# Patient Record
Sex: Female | Born: 1955
Health system: Southern US, Community
[De-identification: ages and names within clinical notes are randomized; demographics above are authoritative.]

## PROBLEM LIST (undated history)

## (undated) DIAGNOSIS — I471 Supraventricular tachycardia, unspecified: Secondary | ICD-10-CM

## (undated) DIAGNOSIS — E288 Other ovarian dysfunction: Secondary | ICD-10-CM

## (undated) DIAGNOSIS — K219 Gastro-esophageal reflux disease without esophagitis: Secondary | ICD-10-CM

## (undated) DIAGNOSIS — E785 Hyperlipidemia, unspecified: Secondary | ICD-10-CM

## (undated) DIAGNOSIS — K449 Diaphragmatic hernia without obstruction or gangrene: Secondary | ICD-10-CM

## (undated) DIAGNOSIS — E2839 Other primary ovarian failure: Secondary | ICD-10-CM

## (undated) DIAGNOSIS — G5711 Meralgia paresthetica, right lower limb: Secondary | ICD-10-CM

## (undated) DIAGNOSIS — G4700 Insomnia, unspecified: Secondary | ICD-10-CM

## (undated) DIAGNOSIS — J45909 Unspecified asthma, uncomplicated: Secondary | ICD-10-CM

## (undated) DIAGNOSIS — N83209 Unspecified ovarian cyst, unspecified side: Secondary | ICD-10-CM

## (undated) DIAGNOSIS — K2950 Unspecified chronic gastritis without bleeding: Secondary | ICD-10-CM

## (undated) DIAGNOSIS — L900 Lichen sclerosus et atrophicus: Secondary | ICD-10-CM

## (undated) DIAGNOSIS — G43909 Migraine, unspecified, not intractable, without status migrainosus: Secondary | ICD-10-CM

## (undated) DIAGNOSIS — K5790 Diverticulosis of intestine, part unspecified, without perforation or abscess without bleeding: Secondary | ICD-10-CM

## (undated) HISTORY — DX: Unspecified ovarian cyst, unspecified side: N83.209

## (undated) HISTORY — PX: CARDIAC ELECTROPHYSIOLOGY STUDY AND ABLATION: SHX1294

## (undated) HISTORY — DX: Meralgia paresthetica, right lower limb: G57.11

## (undated) HISTORY — DX: Other ovarian dysfunction: E28.8

## (undated) HISTORY — DX: Hyperlipidemia, unspecified: E78.5

## (undated) HISTORY — DX: Unspecified chronic gastritis without bleeding: K29.50

## (undated) HISTORY — DX: Migraine, unspecified, not intractable, without status migrainosus: G43.909

## (undated) HISTORY — DX: Supraventricular tachycardia: I47.1

## (undated) HISTORY — PX: SHOULDER SURGERY: SHX246

## (undated) HISTORY — DX: Unspecified asthma, uncomplicated: J45.909

## (undated) HISTORY — DX: Gastro-esophageal reflux disease without esophagitis: K21.9

## (undated) HISTORY — DX: Supraventricular tachycardia, unspecified: I47.10

## (undated) HISTORY — DX: Other primary ovarian failure: E28.39

## (undated) HISTORY — DX: Diverticulosis of intestine, part unspecified, without perforation or abscess without bleeding: K57.90

## (undated) HISTORY — DX: Diaphragmatic hernia without obstruction or gangrene: K44.9

## (undated) HISTORY — DX: Insomnia, unspecified: G47.00

## (undated) HISTORY — PX: OOPHORECTOMY: SHX86

## (undated) HISTORY — PX: TEMPOROMANDIBULAR JOINT SURGERY: SHX35

## (undated) HISTORY — DX: Lichen sclerosus et atrophicus: L90.0

---

## 1998-02-25 ENCOUNTER — Other Ambulatory Visit: Admission: RE | Admit: 1998-02-25 | Discharge: 1998-02-25 | Payer: Self-pay | Admitting: Obstetrics and Gynecology

## 1998-06-28 ENCOUNTER — Ambulatory Visit (HOSPITAL_COMMUNITY): Admission: RE | Admit: 1998-06-28 | Discharge: 1998-06-28 | Payer: Self-pay | Admitting: Emergency Medicine

## 1998-06-28 ENCOUNTER — Encounter: Payer: Self-pay | Admitting: Emergency Medicine

## 1999-03-20 ENCOUNTER — Other Ambulatory Visit: Admission: RE | Admit: 1999-03-20 | Discharge: 1999-03-20 | Payer: Self-pay | Admitting: Obstetrics and Gynecology

## 1999-04-10 ENCOUNTER — Encounter: Payer: Self-pay | Admitting: Emergency Medicine

## 1999-04-10 ENCOUNTER — Emergency Department (HOSPITAL_COMMUNITY): Admission: EM | Admit: 1999-04-10 | Discharge: 1999-04-10 | Payer: Self-pay | Admitting: Emergency Medicine

## 2000-05-13 ENCOUNTER — Other Ambulatory Visit: Admission: RE | Admit: 2000-05-13 | Discharge: 2000-05-13 | Payer: Self-pay | Admitting: Obstetrics and Gynecology

## 2001-07-14 ENCOUNTER — Other Ambulatory Visit: Admission: RE | Admit: 2001-07-14 | Discharge: 2001-07-14 | Payer: Self-pay | Admitting: Obstetrics and Gynecology

## 2002-07-17 ENCOUNTER — Other Ambulatory Visit: Admission: RE | Admit: 2002-07-17 | Discharge: 2002-07-17 | Payer: Self-pay | Admitting: Obstetrics and Gynecology

## 2003-08-19 ENCOUNTER — Other Ambulatory Visit: Admission: RE | Admit: 2003-08-19 | Discharge: 2003-08-19 | Payer: Self-pay | Admitting: Obstetrics and Gynecology

## 2004-07-23 ENCOUNTER — Emergency Department (HOSPITAL_COMMUNITY): Admission: EM | Admit: 2004-07-23 | Discharge: 2004-07-23 | Payer: Self-pay | Admitting: Emergency Medicine

## 2004-08-07 ENCOUNTER — Other Ambulatory Visit: Admission: RE | Admit: 2004-08-07 | Discharge: 2004-08-07 | Payer: Self-pay | Admitting: Obstetrics and Gynecology

## 2004-08-30 ENCOUNTER — Ambulatory Visit: Payer: Self-pay | Admitting: Internal Medicine

## 2004-09-19 ENCOUNTER — Ambulatory Visit: Payer: Self-pay

## 2004-09-19 ENCOUNTER — Ambulatory Visit: Payer: Self-pay | Admitting: Internal Medicine

## 2004-10-09 ENCOUNTER — Ambulatory Visit: Payer: Self-pay | Admitting: Internal Medicine

## 2004-10-12 ENCOUNTER — Ambulatory Visit: Payer: Self-pay

## 2004-12-04 ENCOUNTER — Ambulatory Visit: Payer: Self-pay | Admitting: Internal Medicine

## 2005-01-17 ENCOUNTER — Ambulatory Visit: Payer: Self-pay | Admitting: Internal Medicine

## 2005-01-25 ENCOUNTER — Ambulatory Visit: Payer: Self-pay | Admitting: Internal Medicine

## 2005-02-01 ENCOUNTER — Ambulatory Visit (HOSPITAL_COMMUNITY): Admission: AD | Admit: 2005-02-01 | Discharge: 2005-02-02 | Payer: Self-pay | Admitting: Internal Medicine

## 2005-02-01 ENCOUNTER — Ambulatory Visit: Payer: Self-pay | Admitting: Internal Medicine

## 2005-02-22 ENCOUNTER — Ambulatory Visit: Payer: Self-pay | Admitting: Internal Medicine

## 2005-03-28 ENCOUNTER — Ambulatory Visit: Payer: Self-pay | Admitting: Internal Medicine

## 2005-06-12 ENCOUNTER — Ambulatory Visit: Payer: Self-pay | Admitting: Internal Medicine

## 2005-06-12 ENCOUNTER — Ambulatory Visit (HOSPITAL_COMMUNITY): Admission: RE | Admit: 2005-06-12 | Discharge: 2005-06-12 | Payer: Self-pay | Admitting: Internal Medicine

## 2005-08-02 ENCOUNTER — Ambulatory Visit: Payer: Self-pay | Admitting: Internal Medicine

## 2005-08-28 ENCOUNTER — Other Ambulatory Visit: Admission: RE | Admit: 2005-08-28 | Discharge: 2005-08-28 | Payer: Self-pay | Admitting: Obstetrics and Gynecology

## 2006-03-15 ENCOUNTER — Ambulatory Visit: Payer: Self-pay | Admitting: Internal Medicine

## 2006-08-30 ENCOUNTER — Other Ambulatory Visit: Admission: RE | Admit: 2006-08-30 | Discharge: 2006-08-30 | Payer: Self-pay | Admitting: Obstetrics and Gynecology

## 2007-05-15 ENCOUNTER — Ambulatory Visit: Payer: Self-pay | Admitting: Internal Medicine

## 2007-09-16 ENCOUNTER — Encounter: Payer: Self-pay | Admitting: Obstetrics and Gynecology

## 2007-09-16 ENCOUNTER — Ambulatory Visit: Payer: Self-pay | Admitting: Obstetrics and Gynecology

## 2007-09-16 ENCOUNTER — Other Ambulatory Visit: Admission: RE | Admit: 2007-09-16 | Discharge: 2007-09-16 | Payer: Self-pay | Admitting: Obstetrics and Gynecology

## 2007-11-21 ENCOUNTER — Encounter: Admission: RE | Admit: 2007-11-21 | Discharge: 2007-11-21 | Payer: Self-pay | Admitting: Emergency Medicine

## 2008-09-29 ENCOUNTER — Other Ambulatory Visit: Admission: RE | Admit: 2008-09-29 | Discharge: 2008-09-29 | Payer: Self-pay | Admitting: Obstetrics and Gynecology

## 2008-09-29 ENCOUNTER — Encounter: Payer: Self-pay | Admitting: Obstetrics and Gynecology

## 2008-09-29 ENCOUNTER — Ambulatory Visit: Payer: Self-pay | Admitting: Obstetrics and Gynecology

## 2009-10-11 ENCOUNTER — Other Ambulatory Visit: Admission: RE | Admit: 2009-10-11 | Discharge: 2009-10-11 | Payer: Self-pay | Admitting: Obstetrics and Gynecology

## 2009-10-11 ENCOUNTER — Ambulatory Visit: Payer: Self-pay | Admitting: Obstetrics and Gynecology

## 2010-03-10 ENCOUNTER — Telehealth: Payer: Self-pay | Admitting: Internal Medicine

## 2010-03-23 NOTE — Progress Notes (Signed)
Summary: surgical clearance  Phone Note Call from Patient Call back at Home Phone [phone]   Caller: Patient Reason for Call: Talk to Nurse Summary of Call: pt states she needs surgical clearance fax to dr. Turner Daniels office [facility] # [phone] for shoulder surgery. Initial call taken by: Roe Coombs,  March 10, 2010 1:15 PM  Follow-up for Phone Call        LM FOR [facility] AT [phone] FROM DR ROWAN'S OFFICE TO Cleotis Lema  RE PT'S SURGERY. KATHY AWARE DR Graciela Husbands LAST SAW PT IN 4/09 STABLE AND TO RETURN AS NEEDED .PER PT NO CARDIAC COMPLAINTS  WILL F/U WITH PMD FOR SX CLEARANCE.PT TO CALL AND MAKE ARRANGEMENTS. DR Cecille Po OFFICE AWARE OF ABOVE AS WELL. Follow-up by: Scherrie Bateman, LPN,  March 10, 2010 1:39 PM  Additional Follow-up for Phone Call Additional follow up Details #1::        thx appropriate Additional Follow-up by: Nathen May, MD, [facility],  March 13, 2010 6:22 PM

## 2010-03-24 ENCOUNTER — Telehealth (INDEPENDENT_AMBULATORY_CARE_PROVIDER_SITE_OTHER): Payer: Self-pay | Admitting: *Deleted

## 2010-03-28 NOTE — Progress Notes (Signed)
Summary: Records Request  Faxed OV & EKG to [facility] at [facility] [phone]). Debby Freiberg  March 24, 2010 11:46 AM

## 2010-05-30 NOTE — Assessment & Plan Note (Signed)
[facility]                         ELECTROPHYSIOLOGY OFFICE NOTE   NAME:Authier, Kim Yu                     MRN:          [id number]  DATE:05/15/2007                            DOB:          06-30-1955    Ms. Kim Yu comes in followup for her atrial tachycardia, this has been  largely quiescent.  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   She had no complaints of exercise intolerance or chest pain or shortness  of breath.  There was no edema.   Her issues relate primarily to her migraine headaches and she takes a  series of medications mostly on an as-needed basis for them.   EXAMINATION:  Her weight is down 7 pounds from last year at 161, her  blood pressure is 112/74, the pulse of 80.  LUNGS:  Clear, but they were markedly diminished on the right side  compared to the left both anteriorly and posteriorly.  HEART SOUNDS:  Regular.  EXTREMITIES:  Without edema.  SKIN:  Warm and dry.   Because of the abnormality of her breath sounds, I undertook a chest x-  ray which appears to be normal.  Radiograph interpretation is pending.   Her electrocardiogram demonstrated sinus rhythm at 76;  there was a  0.13/0.08/0.39, axis was 79 degrees.   IMPRESSION:  1. Atrial tachycardia status post ablation with minimal clinical      effects at this point.  2. Abnormal breath sounds with normal chest x-ray.  3. Migraine headaches.   [name] is stable, will plan to see her again as needed.     Duke Salvia, MD, [facility]  Electronically Signed    SCK/MedQ  DD: 05/15/2007  DT: 05/15/2007  Job #: [id number]   cc:   Reuben Likes, M.D.

## 2010-06-02 NOTE — Assessment & Plan Note (Signed)
[facility]                         ELECTROPHYSIOLOGY OFFICE NOTE   NAME:[name], Kim BROCKSMITH                     MRN:          [id number]  DATE:03/15/2006                            DOB:          [birth year]    [name] comes in.  She has had one episode of palpitations since  we saw her last.  It lasted about 5 minutes.   She has undergone a procedure with a double atrial tachycardia ablation  and then she went back to the lab in May for repeat procedure which was  unsuccessful, the details of which I do not have.  She has done really  well with only a few minutes of tachypalpitations in January.  Otherwise, she has no symptoms.  On examination, her blood pressure is  110/66, pulse was 80.  Lungs were clear, heart sounds were regular, and  extremities were without edema.   Electrocardiogram dated today demonstrated sinus rhythm at 85 with  intervals of 0.13/0.08/0.35.   IMPRESSION:  1. Atrial tachycardia - multiple with procedure x2.  2. Intermittent recurrent tachypalpitations.   Will plan to see her again in 1 year's time.     Duke Salvia, MD, [facility]  Electronically Signed    SCK/MedQ  DD: 03/15/2006  DT: 03/15/2006  Job #: [id number]   cc:   Reuben Likes, M.D.

## 2010-06-02 NOTE — H&P (Signed)
NAME:  Kim [name], Kim [name] NO.:  [id number]   MEDICAL RECORD NO.:  [id number]          PATIENT TYPE:  OIB   LOCATION:  2899                         FACILITY:  [facility]   PHYSICIAN:  Duke Salvia, M.D.  DATE OF BIRTH:  05/24/1955   DATE OF ADMISSION:  06/12/2005  DATE OF DISCHARGE:                                HISTORY & PHYSICAL   ELECTROPHYSIOLOGIST:  Dr. Sherryl Manges.   PRIMARY CARE-GIVER:  Dr. Leslee Home.   ALLERGIES:  DARVON/COMPAZINE.   PRESENTING CIRCUMSTANCE:  I'm for another go-round.   HISTORY OF PRESENT ILLNESS:  Kim [name] is a 55 year old female with  a history of tachy-palpitation manifested as heart racing and presyncope.  She had ESI-guided EPS with radiofrequency catheter ablation of A-V nodal  reentry dysrhythmia AND high right atrial cristal tachycardia on February 01, 2005.   She has had recurrence, though she states the feeling is different with her  current tachyarrhythmia.  She feels palpitation and dyspnea.  They occur  maybe once every 2 weeks.  They are dispelled by p.r.n. Cardizem.   Loop recording demonstrates an atrial tachycardia.  The patient presents for  further EPS/radiofrequency catheter ablation of this dysrhythmia.   PAST MEDICAL HISTORY:  1.  Tachyarrhythmias as in history of present illness.  2.  Migraine headaches.   The patient denies myocardial infarction, cerebrovascular accident,  hypertension, diabetes, deep venous thrombosis, pulmonary embolism, peptic  ulcer disease, lower extremity edema, orthopnea, frank syncope, vertigo,  left lower extremity ulcerations, paroxysmal nocturnal dyspnea, nocturia,  anxiety, depression, weight gain or loss, no fevers, chills, nausea or  vomiting.  Positives are migraines/palpitations/presyncope.   MEDICATIONS:  1.  Imipramine 50 mg daily at bedtime.  2.  Trazodone 25 mg daily at bedtime.  3.  Aspirin 81 mg daily.  4.  __________  at bedtime.  5.  Diltiazem 30 mg as  needed.  6.  Metoclopramide 10 mg as needed for migraine.  7.  Ergotamine/caffeine p.r.n. for migraines.  8.  Hydrocodone 7.5 mg as needed for migraines.  9.  Promethazine 25 mg as needed for migraines.  10. Aleve to take as needed for migraines.  11. The patient also takes Os-Cal 500 mg daily, fish oil capsules daily and      a Centrum vitamin daily.   SOCIAL HISTORY:  The patient lives in [place].  She is married.  She is a  Runner, broadcasting/film/video.  No tobacco habituation.  Occasional alcohol.   FAMILY HISTORY:  Noncontributory.   PHYSICAL EXAMINATION:  VITAL SIGNS:  Temperature 97.9, blood pressure  120/83, pulse is 102, respiration rate is 29, oxygen saturation 60% on room  air.  GENERAL:  The patient is alert and oriented x3, in no acute distress.  HEENT:  Eyes:  Pupils are equal, round and reactive to light.  Extraocular  movements are intact.  No nasal discharge.  NECK:  Supple.  No carotid bruits auscultated.  No jugular venous  distention.  No cervical lymphadenopathy.  LUNGS:  Clear to auscultation and percussion bilaterally.  HEART:  Regular rate and rhythm with S1 and S2  and no murmur.  ABDOMEN:  Soft and non-distended.  Bowel sounds are present and nontender.  Abdominal aorta is non-pulsatile and non-expansile.  There was no  hepatosplenomegaly.  EXTREMITIES:  No edema.  Dorsalis pedis pulses 4/4 bilaterally.  Radial  pulses 4/4 bilaterally.  NEUROLOGIC:  No deficits.   IMPRESSION:  1.  Recurrent atrial tachyarrhythmias demonstrating as atrial tachycardia.  2.  Migraine headaches.   PLAN:  Electrophysiology study/radiofrequency catheter ablation by Dr.  Sherryl Manges.      Maple Mirza, P.A.    ______________________________  Duke Salvia, M.D.    GM/MEDQ  D:  06/12/2005  T:  06/12/2005  Job:  [id number]

## 2010-06-02 NOTE — Op Note (Signed)
NAME:  Kim [name], Kim [name] NO.:  [id number]   MEDICAL RECORD NO.:  [id number]          PATIENT TYPE:  OIB   LOCATION:  3732                         FACILITY:  [facility]   PHYSICIAN:  Duke Salvia, M.D.  DATE OF BIRTH:  May 17, 1955   DATE OF PROCEDURE:  02/01/2005  DATE OF DISCHARGE:                                 OPERATIVE REPORT   PREOPERATIVE DIAGNOSIS:  Supraventricular tachycardia.   POSTOPERATIVE DIAGNOSIS:  AV nodal re-entry tachycardia; high right atrial  cristal tachycardia.   PROCEDURE PERFORMED:  Invasive electrophysiologic study, arrhythmia mapping  x 2 with electromechanical assistance; radio frequency catheter ablation x  2; electrophysiologic study following drug infusion.   Following the obtaining of informed consent, the patient was brought to the  electrophysiology laboratory and placed on the fluoroscopic table in supine  position.  After routine prep and drape, cardiac catheterization was  performed with local anesthesia and conscious sedation.  Noninvasive blood  pressure monitoring and transcutaneous oxygen saturation monitoring and end  tidal CO2 monitoring were performed continuously throughout the procedure.  Following the procedure, the catheter was removed, hemostasis was obtained,  and the patient was transferred to the holding area in stable condition.  In  fact the sheaths were removed in the holding area.   CATHETERS:  A 5 French quadripolar catheter was inserted via the left  femoral vein to the atrioventricular junction.  A 5 French quadripolar catheter was inserted via the left femoral vein to  the right ventricular apex.  A 6 French octapolar catheter was inserted via the right femoral vein to the  coronary sinus.  A 7 French ESI Array catheter was inserted via the left femoral vein to the  high right atrium.  A 7 French 4 mm deflectable tip ablation catheter was inserted via an SL4  sheath to mapping sites in the posteroseptal  space for AV nodal re-entry and  to the high right atrium for mapping and ablation of the high right atrial  tachycardia.   Surface leads 1, aVF, and V1 were monitored continuously throughout the  procedure.   Following insertion of the catheters, the stimulation protocol included  incremental atrial pacing, incremental ventricular pacing, single and double  atrial extrastimuli at pace cycle lengths of 500 and 400 msec, burst atrial  pacing.   RESULTS:  Surface electrocardiogram.  Initial  rhythm was sinus; R-R interval: 689 msec; P-R interval: 124 msec;  QRS duration: 100 msec; QT interval 403 msec; P wave duration is 99 msec;  bundle branch block: Absent; pre-excitation: Absent.   A-H interval : 70 msec. HV interval: 54 msec.   Following AV nodal slow pathway modification.  Rhythm: Sinus; R-R interval: 77 msec; P-R interval: 142 msec; QRS duration:  85 msec; QT interval: 342 msec; P-wave duration: 120 msec; A-H interval: 96  msec; H-V interval: 50 msec.   Pre-excitation: Absent; bundle branch block: Absent.   Final post second ablation.  Rhythm: Sinus; R-R interval: 667 msec; P-R interval: 140 msec; QRS duration:  100 msec; Q-T interval: N/M; P-wave duration: N/M; bundle branch block:  Absent; pre-excitation: Absent.  A-H interval: 68 msec; H-V interval: 45 msec.   AV nodal Wenckebach was 350 msec and following ablation the AV nodal  effective refractory period at 500 msec was 340 msec.  AV nodal conduction pre-ablation  1.  Discontinued further inducible echo beats and SVT.  Following ablation, AV nodal conduction was continuous.   ACCESSORY PATHWAY FUNCTION:  No evidence of an accessory pathway was  identified.   ARRHYTHMIAS INDUCED:  1.  AV nodal re-entry tachycardia was reproducibly inducible with double      atrial extrastimuli delivered from the coronary sinus.  The cycle length      was approximately 330 msec.  It was terminated by atrial pacing.       Characteristics of the tachycardia are listed on the lab sheet but were      consistent with (1) earliest atrial activation His, (2) VA times very      close to 0 msec.   Following slow pathway modification as noted previously, AV nodal conduction  was continuous without inducible tachycardia.   Hereafter, isoproterenol was given up to 2 mcg per minute with rapid burst  atrial pacing at 220 msec reproducibly inducing an atrial tachycardia with a  cycle length of 340 to 360 msec.  It was characterized by V-A-A-V  conduction.  Endocardial activation mapping using the electro anatomical  array demonstrated earliest atrial activation in the high right crista.  Radio frequency energy was then delivered along the area of breakout in a  triangular fashion from early activation to about half way down to breakout  and creating a line across the path of activation.  Following ablation,  attempts to induce tachycardia were associated with recurrent induction of  atrial fibrillation, so further induction attempts were not undertaken.   FLUOROSCOPY TIME:  A total of 14 minutes and 75 seconds of fluoroscopy time  was utilized at 7.5 frames per second in biplane mode.  Radio frequency  energy at a total of 12 minutes and 5 seconds of radio frequency energy were  delivered at the two sites as noted previously, the AV nodal slow pathway as  well as the high right atrial cristal tachycardia.  AV nodal slow pathway  modification was successful; right atrial tachycardia was ablated  anatomically based on the endocardial array and attempts to reinduce  following the ablation were not pursued vigorously because of recurrent  atrial fibrillation requiring DC cardioversion.   IMPRESSION:  1.  Normal sinus function.  2.  Abnormal atrial function manifested by high right atrial tachycardia      that was successfully ablated. 3.  Abnormal AV nodal function manifested by dual antegrade AV nodal      physiology  with inducible slow-fast AV nodal re-entry tachycardia; slow      pathway modification eliminated the substrate for this tachycardia as      well .  4.  Normal His-Purkinje system function.  5.  No accessory pathway.  6.  Normal ventricular response to programmed stimulation.   In summary, the results of electrophysiologic testing confirmed two  different electrophysiologic substrates for the patient's two documented  tachycardias.  Slow pathway modification and high right atrial cristal  tachycardia were both eliminated using radio frequency energy with issues  regarding the long term predictive value of the atrial tachycardia modified  because of the inducible atrial fibrillation with post ablation testing.  However, I am still quite pleased with the anatomical distribution of  lesions in the area of the tachycardia.  Will plan to discontinue her medications, observe her overnight, anticipate  discharge in the morning.           ______________________________  Duke Salvia, M.D.     SCK/MEDQ  D:  02/01/2005  T:  02/01/2005  Job:  [id number]   cc:   Electrophysiology lab   Reuben Likes, M.D.  Fax: [phone]

## 2010-06-02 NOTE — Op Note (Signed)
NAME:  Kim [name], GROSECLOSE NO.:  [id number]   MEDICAL RECORD NO.:  [id number]          PATIENT TYPE:   LOCATION:                                 FACILITY:   PHYSICIAN:  Duke Salvia, MD, [facility]   DATE OF BIRTH:   DATE OF PROCEDURE:  DATE OF DISCHARGE:                               OPERATIVE REPORT   PREOPERATIVE DIAGNOSIS:  Supraventricular tachycardia.   POSTOPERATIVE DIAGNOSIS:  Atrial tachycardia, potentially left atrial.   PROCEDURE:  Invasive electrophysiological study, arrhythmia mapping and  RF catheter ablation.   DESCRIPTION OF PROCEDURE:  Following obtained informed consent, the  patient was brought to the electrophysiology laboratory and placed on  the fluoroscopic table in the supine position.  After routine prep and  drape, cardiac catheterization was performed with local anesthesia and  conscious sedation.  Noninvasive blood pressure monitoring,  transcutaneous oxygen saturation monitoring were performed continuously  throughout the procedure.  Following the procedure, the catheter was  removed.  Hemostasis was obtained and the patient was transferred to the  floor in stable condition.   The catheter was 5-French quadripolar catheter was inserted via the left  femoral vein to the AV junction.   A 7-French dual decapolar catheter was inserted via left femoral vein to  the tricuspid annulus with mapping sites of the right atrium.   A pair of ablation catheters were then inserted via the right femoral  vein to mapping sites in the right atrium.   __________ aVF __________ monitored continuously throughout the  procedure.  Following insertion of the catheters, the stimulation  protocol included incremental atrial pacing.   Incremental ventricular pacing.   Single atrial extra stimuli.   RESULTS:  1. Surface cardiogram and basic intervals.      a.     Rhythm:  Sinus; RR interval:  559 milliseconds; PR interval:       112 milliseconds; QRS  duration:  106 milliseconds; QT interval 381       milliseconds; P-wave duration 91 milliseconds.  Bundle branch       block:  Absent; pre-excitation:  Absent.      b.     AH interval:  75 milliseconds.  HV interval:  50       milliseconds.   FINAL MEASUREMENTS:  Rhythm:  Sinus; RR interval 598 milliseconds; PR  interval 147 milliseconds; QRS duration:  96 milliseconds; QT interval  356 milliseconds; P-wave duration 94 milliseconds.  AH interval:  N/R; HV interval:  N/R.  AV nodal function.  AV Wenckebach cycle length was 450 milliseconds.  Antegrade conduction was continuous.   ACCESSORY PATHWAY FUNCTION:  No accessory pathway was identified.   Arrhythmias induced:  An atrial tachycardia was induced and with bipolar  mapping bipolar dual catheter mapping was mapped to the crista.  The  earliest site was found to be about 40 milliseconds prior onset of  surface P.  However, we were not able to render the tachycardia non  inducible.  Low voltage early signals suggested that the origin might  have actually been coming from the left atrium and coming through  to the  right atrium posteriorly.   Fluoroscopy time:  A total of 25 minutes and 13 seconds of fluoroscopy  time was utilized at 7 and 1/2 frames per second in a biplane mode.   IMPRESSION:  Failed right atrial tachycardia ablation with extensive  arrhythmia mapping.   RECOMMENDATIONS:  The patient's recommendations were that a low dose  beta blocker be started.      Duke Salvia, MD, [facility]  Electronically Signed     SCK/MEDQ  D:  10/03/2006  T:  10/03/2006  Job:  [id number]

## 2010-06-02 NOTE — Discharge Summary (Signed)
NAME:  Kim [name], Kim [name] NO.:  [id number]   MEDICAL RECORD NO.:  [id number]          PATIENT TYPE:  OIB   LOCATION:  3732                         FACILITY:  [facility]   PHYSICIAN:  Maple Mirza, P.A. DATE OF BIRTH:  [birth year]   DATE OF ADMISSION:  02/01/2005  DATE OF DISCHARGE:  02/02/2005                                 DISCHARGE SUMMARY   ALLERGIES:  DARVON AND COMPAZINE.   PRINCIPAL DIAGNOSES:  1.  History of palpitations/presyncope with tachyarrhythmias.  2.  Day one status post electrophysiologic study, radiofrequency catheter      ablation of atrioventricular nodal reentry tachycardia.  3.  Atrial tachycardia.   DIAGNOSES:  1.  History of migraine headaches.  2.  Echocardiogram September 2006 shows ejection fraction normal, no wall      motion abnormalities.   PROCEDURES THIS HOSPITALIZATION:  February 01, 2005, electrophysiologic  study, radiofrequency catheter ablation of AV nodal reentry tachycardia in  the presence of atrial tachycardia, Dr. Sherryl Manges practitioner.   BRIEF HISTORY:  Ms. Kim [name] is a 55 year old female who has recurrent episodes  of tachycardia.  The patient wore a Holter monitor for a month but did not  see much.  She had an episode of palpitations and presyncope which recorded  sinus rhythm at about 110.  She had another episode where she was awakened  from sleep and had rapid pounding.  Her husband took her pulse at 160.  They  were frog positive.  They were diuretic positive and abated sometime  thereafter when the patient took diltiazem 30 mg on an as-needed basis.  The  patient has supraventricular tachycardia with probably AV nodal reentry  pattern +/- an atrial tachycardia.  Treatment options have been discussed  with the patient including continuing medications or electrophysiologic  study, radiofrequency catheter ablation.  The patient elects to have the  procedure done.  The risks and benefits have been described.  She  presented  electively.   HOSPITAL COURSE:  The patient presented on January 19th.  She underwent this  EP study and successful radiofrequency catheter ablation.  The patient  discharging day 1.  She had had no further tachyarrhythmia in the post-  procedure period. She is maintaining sinus rhythm.  She is achieving 95%  oxygen saturations at room air.  She developed a migraine headache after the  procedure on January 18th.  She was given hydrocodone metoclopramide and  Aleve for prophylaxis.  The patient discharging stable, satisfactory January  19th.   LABORATORY STUDIES ON ADMISSION:  Complete blood count:  White cells 5.7,  hemoglobin 12.6, hematocrit 36.4, platelets 310.  Serum electrolytes sodium  137, potassium 3.9, chloride 108, bicarbonate 26, BUN is 13, creatinine 0.9,  glucose 96.   MEDICATIONS:  1.  Include imipramine 50 mg daily.  2.  Trazodone 25 mg daily.  3.  Enteric-coated aspirin 81 mg daily.  She is to stay on this for at least      six weeks.  4.  Apri daily.  5.  Diltiazem 30 mg only as needed.  6.  Metoclopramide 10 mg as needed  for migraine.  7.  Ergotamine as needed for migraine.  8.  Vicodin 7.5/750 as needed.  9.  Promethazine 25 mg as needed.  10. Aleve as needed.  11. She is also to continue Centrum/fish oil/Os-Cal.   The patient is asked not to drive for the next two days, to lift nothing  heavier than 10 pounds for the next two weeks.  She may shower.  She is to  call [phone] if she experiences pain or swelling at the catheterization  site.  She will follow with Dr. Graciela Husbands 03/14 at 4 o'clock in the afternoon.   That is a 20 minute.      Maple Mirza, P.A.     GM/MEDQ  D:  02/02/2005  T:  02/02/2005  Job:  [id number]   cc:   Reuben Likes, M.D.  Fax: [phone]

## 2010-06-02 NOTE — Discharge Summary (Signed)
NAME:  [name], [name] NO.:  [id number]   MEDICAL RECORD NO.:  [id number]          PATIENT TYPE:  OIB   LOCATION:  2023                         FACILITY:  [facility]   PHYSICIAN:  Duke Salvia, M.D.  DATE OF BIRTH:  [birth year]   DATE OF ADMISSION:  06/12/2005  DATE OF DISCHARGE:  06/12/2005                                 DISCHARGE SUMMARY   PRIMARY CARE PHYSICIAN:  Dr. Leslee Home.   ELECTROPHYSIOLOGIST:  Dr. Duke Salvia.   REASON FOR ADMISSION:  Atrial tachyarrhythmias.   DISCHARGE DIAGNOSES:  1.  Atrial tachycardia.      1.  Status post unsuccessful attempt at radiofrequency catheter          ablation.  2.  Migraine headaches.   BRIEF HISTORY:  [name] is a 55 year old female patient with history of  tachypalpitations manifested as heart racing, increased syncope.  She had  radiofrequency catheter ablation of AV nodal reentrant tachycardia in  January of 2007.  She had recurrence of this and was admitted EP testing and  radiofrequency catheter ablation.   HOSPITAL COURSE:  The patient was brought in on Jun 12, 2005 as noted.  She  underwent EP testing by Dr. Graciela Husbands.  She is found to have atrial tachycardia.  Attempts were made at radiofrequency catheter ablation but this was  unsuccessful.  She tolerated the procedure well with no immediate  complication.  On the evening of Jun 12, 2005, Dr. Graciela Husbands saw the patient and  felt she was ready for discharge home.  She will need to remain on aspirin  81 mg a day for six weeks and follow up with Dr. Graciela Husbands in the next six to  eight weeks.   DISCHARGE MEDICATIONS:  1.  Coated aspirin 81 mg daily.  2.  Imipramine 50 mg q.h.s.  3.  Trazodone 25 mg q.h.s.  4.  Apri at bed time.  5.  Diltiazem 30 mg as needed.  6.  Metoclopramide 10 mg as needed for migraines.  7.  Ergotamine/caffeine p.r.n. migraines.  8.  Hydrocodone 7.5 mg as needed for migraines.  9.  Promethazine 25 mg as needed for migraines.  10. Aleve  as needed for migraines.  11. Os-Cal 500 mg a day.  12. Fish oil capsules.  13. Centrum vitamins.   ACTIVITY:  The patient is to increase her activity slowly over the next  week.  No driving, heavy lifting or sexual activity for three days.  She may  shower.   WOUND CARE:  She should call our office for any groin swelling, bleeding,  bruising or fever.  Follow up will be with Dr. Graciela Husbands in six to eight weeks  and the office will contact her with an appointment.      Tereso Newcomer, P.A.    ______________________________  Duke Salvia, M.D.    SW/MEDQ  D:  06/12/2005  T:  06/13/2005  Job:  [id number]   cc:   Reuben Likes, M.D.  Fax: [phone]

## 2010-06-02 NOTE — Assessment & Plan Note (Signed)
[facility]                              CARDIOLOGY OFFICE NOTE   NAME:Torpey, ARCHIE ATILANO                     MRN:          [id number]  DATE:08/02/2005                            DOB:          12-27-1955    Ms. Enyeart is seen following a double tachycardia ablation in January, a  repeat ablation in May, the report of which is pending but apparently was  not successful.  She is actually doing quite well with quiescent tachy  palpitations at the current time.  It may have been that there was some  progressive healing afterwards that has mitigated her tachycardia.  Currently she would like to hang in there as it is.  We will plan to keep  her on her current regimen, which includes no heart medications.  Her blood  pressure is 102/64, pulse was 92.  Lungs were clear.   Electrocardiogram demonstrated sinus rhythm at 92.   There was poor R-wave progression.  This not changed from before.   We will see her again in 6 months' time.                               Duke Salvia, MD, [facility], [facility], [facility]    SCK/MedQ  DD:  08/02/2005  DT:  08/02/2005  Job #:  [id number]   cc:   Reuben Likes, MD

## 2010-06-13 ENCOUNTER — Ambulatory Visit (INDEPENDENT_AMBULATORY_CARE_PROVIDER_SITE_OTHER): Payer: BC Managed Care – PPO | Admitting: Obstetrics and Gynecology

## 2010-06-13 DIAGNOSIS — N950 Postmenopausal bleeding: Secondary | ICD-10-CM

## 2010-06-17 ENCOUNTER — Emergency Department (INDEPENDENT_AMBULATORY_CARE_PROVIDER_SITE_OTHER): Payer: BC Managed Care – PPO

## 2010-06-17 ENCOUNTER — Emergency Department (HOSPITAL_BASED_OUTPATIENT_CLINIC_OR_DEPARTMENT_OTHER)
Admission: EM | Admit: 2010-06-17 | Discharge: 2010-06-17 | Disposition: A | Discharge status: Home / Self Care | Payer: BC Managed Care – PPO | Attending: Emergency Medicine | Admitting: Emergency Medicine

## 2010-06-17 DIAGNOSIS — M25569 Pain in unspecified knee: Secondary | ICD-10-CM

## 2010-06-17 DIAGNOSIS — W19XXXA Unspecified fall, initial encounter: Secondary | ICD-10-CM

## 2010-06-17 DIAGNOSIS — W010XXA Fall on same level from slipping, tripping and stumbling without subsequent striking against object, initial encounter: Secondary | ICD-10-CM | POA: Insufficient documentation

## 2010-06-17 DIAGNOSIS — Y92009 Unspecified place in unspecified non-institutional (private) residence as the place of occurrence of the external cause: Secondary | ICD-10-CM | POA: Insufficient documentation

## 2010-06-17 DIAGNOSIS — M25549 Pain in joints of unspecified hand: Secondary | ICD-10-CM

## 2010-06-17 DIAGNOSIS — S61409A Unspecified open wound of unspecified hand, initial encounter: Secondary | ICD-10-CM | POA: Insufficient documentation

## 2010-06-19 ENCOUNTER — Other Ambulatory Visit: Payer: Self-pay | Admitting: Obstetrics and Gynecology

## 2010-06-19 ENCOUNTER — Ambulatory Visit (INDEPENDENT_AMBULATORY_CARE_PROVIDER_SITE_OTHER): Payer: BC Managed Care – PPO | Admitting: Obstetrics and Gynecology

## 2010-06-19 ENCOUNTER — Other Ambulatory Visit: Payer: BC Managed Care – PPO

## 2010-06-19 DIAGNOSIS — N882 Stricture and stenosis of cervix uteri: Secondary | ICD-10-CM

## 2010-06-19 DIAGNOSIS — D259 Leiomyoma of uterus, unspecified: Secondary | ICD-10-CM

## 2010-06-19 DIAGNOSIS — N83339 Acquired atrophy of ovary and fallopian tube, unspecified side: Secondary | ICD-10-CM

## 2010-06-19 DIAGNOSIS — N950 Postmenopausal bleeding: Secondary | ICD-10-CM

## 2011-05-21 ENCOUNTER — Encounter: Payer: Self-pay | Admitting: Obstetrics and Gynecology

## 2011-05-24 ENCOUNTER — Encounter: Payer: Self-pay | Admitting: Gynecology

## 2011-05-24 DIAGNOSIS — N83209 Unspecified ovarian cyst, unspecified side: Secondary | ICD-10-CM | POA: Insufficient documentation

## 2011-05-24 DIAGNOSIS — G43909 Migraine, unspecified, not intractable, without status migrainosus: Secondary | ICD-10-CM | POA: Insufficient documentation

## 2011-05-24 DIAGNOSIS — K449 Diaphragmatic hernia without obstruction or gangrene: Secondary | ICD-10-CM | POA: Insufficient documentation

## 2011-05-31 ENCOUNTER — Encounter: Payer: Self-pay | Admitting: Obstetrics and Gynecology

## 2011-05-31 ENCOUNTER — Ambulatory Visit (INDEPENDENT_AMBULATORY_CARE_PROVIDER_SITE_OTHER): Payer: BC Managed Care – PPO | Admitting: Obstetrics and Gynecology

## 2011-05-31 VITALS — BP 124/76 | Ht 66.0 in | Wt 166.0 lb

## 2011-05-31 DIAGNOSIS — I471 Supraventricular tachycardia: Secondary | ICD-10-CM | POA: Insufficient documentation

## 2011-05-31 DIAGNOSIS — Z01419 Encounter for gynecological examination (general) (routine) without abnormal findings: Secondary | ICD-10-CM

## 2011-05-31 DIAGNOSIS — K219 Gastro-esophageal reflux disease without esophagitis: Secondary | ICD-10-CM | POA: Insufficient documentation

## 2011-05-31 NOTE — Progress Notes (Signed)
Patient came to see me today for her annual GYN exam. She is doing well without significant menopausal symptoms. She does have some mild vaginal dryness. They use a  lubricant which seems to do well. She is up-to-date on mammograms. She is up-to-date on colonoscopy. She is having no vaginal bleeding. She is having no pelvic pain or dyspareunia. She has had 2 normal bone densities. She does her lab through PCP.  Physical examination: Kim Yu present. HEENT within normal limits. Neck: Thyroid not large. No masses. Supraclavicular nodes: not enlarged. Breasts: Examined in both sitting and lying  position. No skin changes and no masses. Abdomen: Soft no guarding rebound or masses or hernia. Pelvic: External: Within normal limits. BUS: Within normal limits. Vaginal:within normal limits. Good estrogen effect. No evidence of cystocele rectocele or enterocele. Cervix: clean. Uterus: Normal size and shape. Adnexa: No masses. Rectovaginal exam: Confirmatory and negative. Extremities: Within normal limits.  Assessment: Normal GYN exam. Mild atrophic vaginitis.  Plan: Continue yearly mammograms. Call when necessary need for vaginal estrogen.

## 2011-06-01 LAB — URINALYSIS W MICROSCOPIC + REFLEX CULTURE
Bacteria, UA: NONE SEEN
Casts: NONE SEEN
Crystals: NONE SEEN
Hgb urine dipstick: NEGATIVE
Ketones, ur: NEGATIVE mg/dL
Nitrite: NEGATIVE
Specific Gravity, Urine: 1.017 (ref 1.005–1.030)
Urobilinogen, UA: 0.2 mg/dL (ref 0.0–1.0)
pH: 6.5 (ref 5.0–8.0)

## 2011-06-02 LAB — URINE CULTURE: Colony Count: 4000

## 2011-06-06 ENCOUNTER — Encounter: Payer: Self-pay | Admitting: Obstetrics and Gynecology

## 2011-07-16 ENCOUNTER — Telehealth: Payer: Self-pay

## 2011-07-16 DIAGNOSIS — N950 Postmenopausal bleeding: Secondary | ICD-10-CM

## 2011-07-16 NOTE — Telephone Encounter (Signed)
PT. NOTIFIED AND TRANSFERRED TO APPTS. ALSO PUT ORDER IN P.C.

## 2011-07-16 NOTE — Telephone Encounter (Signed)
HAS ALREADY BEEN MENOPAUSAL. STATES WHILE ON VACATION 2 WEEKS AGO HAD SPOTTING AND CRAMPING X 2 HOURS AND JUST WANTED TO CHECK WITH YOU ABOUT THIS.

## 2011-07-16 NOTE — Telephone Encounter (Signed)
She needs an office visit. I also would like to ultrasound her. Schedule back to back with office visit first.

## 2011-07-16 NOTE — Addendum Note (Signed)
Addended by: Venora Maples on: 07/16/2011 04:23 PM   Modules accepted: Orders

## 2011-07-23 NOTE — Addendum Note (Signed)
Addended byValeda Malm L on: 07/23/2011 03:01 PM   Modules accepted: Orders

## 2011-08-22 ENCOUNTER — Ambulatory Visit (INDEPENDENT_AMBULATORY_CARE_PROVIDER_SITE_OTHER): Payer: BC Managed Care – PPO | Admitting: Obstetrics and Gynecology

## 2011-08-22 ENCOUNTER — Ambulatory Visit (INDEPENDENT_AMBULATORY_CARE_PROVIDER_SITE_OTHER): Payer: BC Managed Care – PPO

## 2011-08-22 ENCOUNTER — Other Ambulatory Visit: Payer: Self-pay | Admitting: Obstetrics and Gynecology

## 2011-08-22 DIAGNOSIS — D251 Intramural leiomyoma of uterus: Secondary | ICD-10-CM

## 2011-08-22 DIAGNOSIS — N83339 Acquired atrophy of ovary and fallopian tube, unspecified side: Secondary | ICD-10-CM

## 2011-08-22 DIAGNOSIS — N950 Postmenopausal bleeding: Secondary | ICD-10-CM

## 2011-08-22 DIAGNOSIS — D259 Leiomyoma of uterus, unspecified: Secondary | ICD-10-CM

## 2011-08-22 NOTE — Addendum Note (Signed)
Addended by: Dayna Barker on: 08/22/2011 12:07 PM   Modules accepted: Orders

## 2011-08-22 NOTE — Progress Notes (Addendum)
Patient came back to see me today with 2 episodes of postmenopausal bleeding in the past 6 weeks. She bled a year ago and had a normal endometrial biopsy and saline infusion histogram. She has had no bleeding since then. She has had cramping as well and does have fibroids.  Exam: Kennon Portela present.Pelvic exam: External within normal limits. BUS within normal limits. Vaginal exam within normal limits. Cervix is clean without lesions. Uterus is normal size and shape. Adnexa failed to reveal masses. Rectovaginal examination is confirmatory and without masses.   Assessment: Postmenopausal bleeding  Plan: Endometrial biopsy done. Plan ultrasound later today.  Addendum: Patient came back and we proceeded with a saline infusion histogram. Her uterus is anteverted with multiple small fibroids. There has been no change in the fibroids since 2012. Her endometrial echo is 1.1 mm. Her right ovary is normal. Her left ovary is surgically absent but there are no masses in the left adnexa. Her cul-de-sac is free of fluid. Saline was introduced into the uterus and there were no intrauterine cavity defects. Patient was reassured. We had a preliminary discussion about endometrial ablation if bleeding persists. Patient given information and she will let us  know.

## 2011-08-22 NOTE — Patient Instructions (Addendum)
Remain in office for ultrasound.

## 2011-08-22 NOTE — Addendum Note (Signed)
Addended by: Dayna Barker on: 08/22/2011 12:58 PM   Modules accepted: Orders

## 2012-08-29 ENCOUNTER — Encounter: Payer: Self-pay | Admitting: Gynecology

## 2012-12-19 ENCOUNTER — Other Ambulatory Visit: Payer: Self-pay | Admitting: Gastroenterology

## 2012-12-19 DIAGNOSIS — R109 Unspecified abdominal pain: Secondary | ICD-10-CM

## 2012-12-23 ENCOUNTER — Ambulatory Visit
Admission: RE | Admit: 2012-12-23 | Discharge: 2012-12-23 | Disposition: A | Discharge status: Home / Self Care | Payer: BC Managed Care – PPO | Source: Ambulatory Visit | Attending: Gastroenterology | Admitting: Gastroenterology

## 2012-12-23 DIAGNOSIS — R109 Unspecified abdominal pain: Secondary | ICD-10-CM

## 2012-12-25 ENCOUNTER — Other Ambulatory Visit (HOSPITAL_COMMUNITY): Payer: Self-pay | Admitting: Gastroenterology

## 2012-12-25 DIAGNOSIS — R109 Unspecified abdominal pain: Secondary | ICD-10-CM

## 2012-12-25 DIAGNOSIS — R110 Nausea: Secondary | ICD-10-CM

## 2013-01-01 ENCOUNTER — Ambulatory Visit (HOSPITAL_COMMUNITY)
Admission: RE | Admit: 2013-01-01 | Discharge: 2013-01-01 | Disposition: A | Discharge status: Home / Self Care | Payer: 59 | Source: Ambulatory Visit | Attending: Gastroenterology | Admitting: Gastroenterology

## 2013-01-01 DIAGNOSIS — R110 Nausea: Secondary | ICD-10-CM | POA: Insufficient documentation

## 2013-01-01 DIAGNOSIS — R109 Unspecified abdominal pain: Secondary | ICD-10-CM | POA: Insufficient documentation

## 2013-01-01 MED ORDER — TECHNETIUM TC 99M MEBROFENIN IV KIT
5.0000 | PACK | Freq: Once | INTRAVENOUS | Status: AC | PRN
  Administered 2013-01-01: 5 via INTRAVENOUS

## 2013-10-23 ENCOUNTER — Other Ambulatory Visit: Payer: Self-pay | Admitting: Obstetrics and Gynecology

## 2013-10-23 DIAGNOSIS — R928 Other abnormal and inconclusive findings on diagnostic imaging of breast: Secondary | ICD-10-CM

## 2013-11-04 ENCOUNTER — Ambulatory Visit
Admission: RE | Admit: 2013-11-04 | Discharge: 2013-11-04 | Disposition: A | Discharge status: Home / Self Care | Payer: BC Managed Care – PPO | Source: Ambulatory Visit | Attending: Obstetrics and Gynecology | Admitting: Obstetrics and Gynecology

## 2013-11-04 DIAGNOSIS — R928 Other abnormal and inconclusive findings on diagnostic imaging of breast: Secondary | ICD-10-CM

## 2014-02-18 ENCOUNTER — Emergency Department (HOSPITAL_COMMUNITY)
Admission: EM | Admit: 2014-02-18 | Discharge: 2014-02-18 | Disposition: A | Discharge status: Home / Self Care | Payer: BC Managed Care – PPO | Attending: Emergency Medicine | Admitting: Emergency Medicine

## 2014-02-18 ENCOUNTER — Emergency Department (HOSPITAL_COMMUNITY): Payer: BC Managed Care – PPO

## 2014-02-18 ENCOUNTER — Encounter (HOSPITAL_COMMUNITY): Payer: Self-pay | Admitting: Emergency Medicine

## 2014-02-18 DIAGNOSIS — R05 Cough: Secondary | ICD-10-CM

## 2014-02-18 DIAGNOSIS — Z8742 Personal history of other diseases of the female genital tract: Secondary | ICD-10-CM | POA: Insufficient documentation

## 2014-02-18 DIAGNOSIS — Z7982 Long term (current) use of aspirin: Secondary | ICD-10-CM | POA: Insufficient documentation

## 2014-02-18 DIAGNOSIS — Z8679 Personal history of other diseases of the circulatory system: Secondary | ICD-10-CM | POA: Insufficient documentation

## 2014-02-18 DIAGNOSIS — K219 Gastro-esophageal reflux disease without esophagitis: Secondary | ICD-10-CM | POA: Insufficient documentation

## 2014-02-18 DIAGNOSIS — Z8639 Personal history of other endocrine, nutritional and metabolic disease: Secondary | ICD-10-CM | POA: Insufficient documentation

## 2014-02-18 DIAGNOSIS — R059 Cough, unspecified: Secondary | ICD-10-CM

## 2014-02-18 DIAGNOSIS — R079 Chest pain, unspecified: Secondary | ICD-10-CM | POA: Insufficient documentation

## 2014-02-18 DIAGNOSIS — R42 Dizziness and giddiness: Secondary | ICD-10-CM

## 2014-02-18 LAB — CBC
HEMATOCRIT: 39.6 % (ref 36.0–46.0)
Hemoglobin: 13.3 g/dL (ref 12.0–15.0)
MCH: 30.2 pg (ref 26.0–34.0)
MCHC: 33.6 g/dL (ref 30.0–36.0)
MCV: 90 fL (ref 78.0–100.0)
PLATELETS: 244 10*3/uL (ref 150–400)
RBC: 4.4 MIL/uL (ref 3.87–5.11)
RDW: 13 % (ref 11.5–15.5)
WBC: 4.4 10*3/uL (ref 4.0–10.5)

## 2014-02-18 LAB — I-STAT TROPONIN, ED: Troponin i, poc: 0 ng/mL (ref 0.00–0.08)

## 2014-02-18 LAB — BASIC METABOLIC PANEL
Anion gap: 5 (ref 5–15)
BUN: 13 mg/dL (ref 6–23)
CO2: 29 mmol/L (ref 19–32)
Calcium: 9.6 mg/dL (ref 8.4–10.5)
Chloride: 105 mmol/L (ref 96–112)
Creatinine, Ser: 0.74 mg/dL (ref 0.50–1.10)
GFR calc non Af Amer: >90 mL/min (ref >90)
Glucose, Bld: 130 mg/dL — ABNORMAL HIGH (ref 70–99)
POTASSIUM: 3.9 mmol/L (ref 3.5–5.1)
SODIUM: 139 mmol/L (ref 135–145)

## 2014-02-18 MED ORDER — ONDANSETRON HCL 4 MG/2ML IJ SOLN
4.0000 mg | Freq: Once | INTRAMUSCULAR | Status: AC
  Administered 2014-02-18: 4 mg via INTRAVENOUS
  Filled 2014-02-18: qty 2

## 2014-02-18 MED ORDER — MECLIZINE HCL 25 MG PO TABS
25.0000 mg | ORAL_TABLET | Freq: Once | ORAL | Status: AC
  Administered 2014-02-18: 25 mg via ORAL
  Filled 2014-02-18: qty 1

## 2014-02-18 MED ORDER — SODIUM CHLORIDE 0.9 % IV BOLUS (SEPSIS)
1000.0000 mL | Freq: Once | INTRAVENOUS | Status: AC
  Administered 2014-02-18: 1000 mL via INTRAVENOUS

## 2014-02-18 MED ORDER — MECLIZINE HCL 25 MG PO TABS: 25.0000 mg | ORAL_TABLET | Freq: Three times a day (TID) | ORAL | Status: DC | PRN

## 2014-02-18 NOTE — Discharge Instructions (Signed)
Chest Pain (Nonspecific) °It is often hard to give a specific diagnosis for the cause of chest pain. There is always a chance that your pain could be related to something serious, such as a heart attack or a blood clot in the lungs. You need to follow up with your health care provider for further evaluation. °CAUSES  °· Heartburn. °· Pneumonia or bronchitis. °· Anxiety or stress. °· Inflammation around your heart (pericarditis) or lung (pleuritis or pleurisy). °· A blood clot in the lung. °· A collapsed lung (pneumothorax). It can develop suddenly on its own (spontaneous pneumothorax) or from trauma to the chest. °· Shingles infection (herpes zoster virus). °The chest wall is composed of bones, muscles, and cartilage. Any of these can be the source of the pain. °· The bones can be bruised by injury. °· The muscles or cartilage can be strained by coughing or overwork. °· The cartilage can be affected by inflammation and become sore (costochondritis). °DIAGNOSIS  °Lab tests or other studies may be needed to find the cause of your pain. Your health care provider may have you take a test called an ambulatory electrocardiogram (ECG). An ECG records your heartbeat patterns over a 24-hour period. You may also have other tests, such as: °· Transthoracic echocardiogram (TTE). During echocardiography, sound waves are used to evaluate how blood flows through your heart. °· Transesophageal echocardiogram (TEE). °· Cardiac monitoring. This allows your health care provider to monitor your heart rate and rhythm in real time. °· Holter monitor. This is a portable device that records your heartbeat and can help diagnose heart arrhythmias. It allows your health care provider to track your heart activity for several days, if needed. °· Stress tests by exercise or by giving medicine that makes the heart beat faster. °TREATMENT  °· Treatment depends on what may be causing your chest pain. Treatment may include: °· Acid blockers for  heartburn. °· Anti-inflammatory medicine. °· Pain medicine for inflammatory conditions. °· Antibiotics if an infection is present. °· You may be advised to change lifestyle habits. This includes stopping smoking and avoiding alcohol, caffeine, and chocolate. °· You may be advised to keep your head raised (elevated) when sleeping. This reduces the chance of acid going backward from your stomach into your esophagus. °Most of the time, nonspecific chest pain will improve within 2-3 days with rest and mild pain medicine.  °HOME CARE INSTRUCTIONS  °· If antibiotics were prescribed, take them as directed. Finish them even if you start to feel better. °· For the next few days, avoid physical activities that bring on chest pain. Continue physical activities as directed. °· Do not use any tobacco products, including cigarettes, chewing tobacco, or electronic cigarettes. °· Avoid drinking alcohol. °· Only take medicine as directed by your health care provider. °· Follow your health care provider's suggestions for further testing if your chest pain does not go away. °· Keep any follow-up appointments you made. If you do not go to an appointment, you could develop lasting (chronic) problems with pain. If there is any problem keeping an appointment, call to reschedule. °SEEK MEDICAL CARE IF:  °· Your chest pain does not go away, even after treatment. °· You have a rash with blisters on your chest. °· You have a fever. °SEEK IMMEDIATE MEDICAL CARE IF:  °· You have increased chest pain or pain that spreads to your arm, neck, jaw, back, or abdomen. °· You have shortness of breath. °· You have an increasing cough, or you cough   up blood.  You have severe back or abdominal pain.  You feel nauseous or vomit.  You have severe weakness.  You faint.  You have chills. This is an emergency. Do not wait to see if the pain will go away. Get medical help at once. Call your local emergency services (911 in [place] Do not drive  yourself to the hospital. MAKE SURE YOU:  Benign Positional Vertigo Vertigo means you feel like you or your surroundings are moving when they are not. Benign positional vertigo is the most common form of vertigo. Benign means that the cause of your condition is not serious. Benign positional vertigo is more common in older adults. CAUSES  Benign positional vertigo is the result of an upset in the labyrinth system. This is an area in the middle ear that helps control your balance. This may be caused by a viral infection, head injury, or repetitive motion. However, often no specific cause is found. SYMPTOMS  Symptoms of benign positional vertigo occur when you move your head or eyes in different directions. Some of the symptoms may include:  Loss of balance and falls.  Vomiting.  Blurred vision.  Dizziness.  Nausea.  Involuntary eye movements (nystagmus). DIAGNOSIS  Benign positional vertigo is usually diagnosed by physical exam. If the specific cause of your benign positional vertigo is unknown, your caregiver may perform imaging tests, such as magnetic resonance imaging (MRI) or computed tomography (CT). TREATMENT  Your caregiver may recommend movements or procedures to correct the benign positional vertigo. Medicines such as meclizine, benzodiazepines, and medicines for nausea may be used to treat your symptoms. In rare cases, if your symptoms are caused by certain conditions that affect the inner ear, you may need surgery. HOME CARE INSTRUCTIONS   Follow your caregiver's instructions.  Move slowly. Do not make sudden body or head movements.  Avoid driving.  Avoid operating heavy machinery.  Avoid performing any tasks that would be dangerous to you or others during a vertigo episode.  Drink enough fluids to keep your urine clear or pale yellow. SEEK IMMEDIATE MEDICAL CARE IF:   You develop problems with walking, weakness, numbness, or using your arms, hands, or legs.  You  have difficulty speaking.  You develop severe headaches.  Your nausea or vomiting continues or gets worse.  You develop visual changes.  Your family or friends notice any behavioral changes.  Your condition gets worse.  You have a fever.  You develop a stiff neck or sensitivity to light. MAKE SURE YOU:   Understand these instructions.  Will watch your condition.  Will get help right away if you are not doing well or get worse. Document Released: 10/09/2005 Document Revised: 03/26/2011 Document Reviewed: 09/21/2010 [facility] Patient Information 2015 [place], [place]. This information is not intended to replace advice given to you by your health care provider. Make sure you discuss any questions you have with your health care provider.   Understand these instructions.  Will watch your condition.  Will get help right away if you are not doing well or get worse. Document Released: 10/11/2004 Document Revised: 01/06/2013 Document Reviewed: 08/07/2007 [facility] Patient Information 2015 [place], [place]. This information is not intended to replace advice given to you by your health care provider. Make sure you discuss any questions you have with your health care provider.

## 2014-02-18 NOTE — ED Notes (Signed)
Pt alert x4 respirations easy non labored. Ambulates with steady gait.

## 2014-02-18 NOTE — ED Notes (Signed)
Dr. Mingo Amber back in with pt

## 2014-02-18 NOTE — ED Provider Notes (Signed)
CSN: [id number]     Arrival date & time 02/18/14  0712 History   First MD Initiated Contact with Patient 02/18/14 0719     Chief Complaint  Patient presents with  . Chest Pain  . Dizziness     (Consider location/radiation/quality/duration/timing/severity/associated sxs/prior Treatment) Patient is a 59 y.o. female presenting with chest pain and dizziness. The history is provided by the patient.  Chest Pain Pain location:  L chest Pain quality: sharp   Pain radiates to:  Does not radiate Pain radiates to the back: no   Pain severity:  Mild Onset quality:  Sudden Duration: 1 minute. Timing:  Constant Progression:  Resolved Chronicity:  New Context: at rest   Relieved by:  Nothing Worsened by:  Nothing tried Associated symptoms: dizziness   Associated symptoms: no cough, no fever, no nausea, no shortness of breath and not vomiting   Dizziness Quality:  Imbalance Severity:  Mild Onset quality:  Gradual Timing:  Intermittent Progression:  Unchanged Chronicity:  New Context: standing up (this morning)   Relieved by:  Nothing Worsened by:  Nothing tried Associated symptoms: chest pain   Associated symptoms: no diarrhea, no nausea, no shortness of breath and no vomiting   Risk factors: no hx of stroke, no hx of vertigo and no new medications     Past Medical History  Diagnosis Date  . Ovarian cyst   . Premature ovarian failure   . Hiatal hernia   . Migraines   . SVT (supraventricular tachycardia)   . Acid reflux    Past Surgical History  Procedure Laterality Date  . Temporomandibular joint surgery    . Oophorectomy      LSO  . Cardiac electrophysiology study and ablation    . Shoulder surgery     Family History  Problem Relation Age of Onset  . Hypertension Mother   . Diabetes Mother   . Breast cancer Mother     Age 62  . Hypertension Father   . Lymphoma Maternal Aunt   . Ovarian cancer Maternal Aunt     [employer]  . Breast cancer Paternal Grandmother      Pat. GR GM-Age unknown   History  Substance Use Topics  . Smoking status: Never Smoker   . Smokeless tobacco: Not on file  . Alcohol Use: Not on file     Comment: rare   OB History    Gravida Para Term Preterm AB TAB SAB Ectopic Multiple Living   2 1 1  1  1   1      Review of Systems  Constitutional: Negative for fever and chills.  Respiratory: Negative for cough and shortness of breath.   Cardiovascular: Positive for chest pain.  Gastrointestinal: Negative for nausea, vomiting and diarrhea.  Neurological: Positive for dizziness.  All other systems reviewed and are negative.     Allergies  Compazine; Darvon; and Pantoprazole  Home Medications   Prior to Admission medications   Medication Sig Start Date End Date Taking? Authorizing Provider  aspirin 81 MG tablet Take 81 mg by mouth daily.   Yes Historical Provider, MD  Famotidine (PEPCID PO) Take by mouth.   Yes Historical Provider, MD  Naproxen Sodium (ALEVE PO) Take by mouth.   Yes Historical Provider, MD   BP 132/97 mmHg  Pulse 64  Temp(Src) 97.5 F (36.4 C) (Oral)  Resp 18  SpO2 99% Physical Exam  Constitutional: She is oriented to person, place, and time. She appears well-developed and well-nourished.  No distress.  HENT:  Head: Normocephalic and atraumatic.  Mouth/Throat: Oropharynx is clear and moist.  Eyes: EOM are normal. Pupils are equal, round, and reactive to light.  Neck: Normal range of motion. Neck supple.  Cardiovascular: Normal rate and regular rhythm.  Exam reveals no friction rub.   No murmur heard. Pulmonary/Chest: Effort normal and breath sounds normal. No respiratory distress. She has no wheezes. She has no rales.  Abdominal: Soft. She exhibits no distension. There is no tenderness. There is no rebound.  Musculoskeletal: Normal range of motion. She exhibits no edema.  Neurological: She is alert and oriented to person, place, and time. No cranial nerve deficit. She exhibits normal muscle  tone. Coordination normal.  Skin: No rash noted. She is not diaphoretic.  Nursing note and vitals reviewed.   ED Course  Procedures (including critical care time) Labs Review Labs Reviewed  [place], ED    Imaging Review Dg Chest 2 View  02/18/2014   CLINICAL DATA:  Sharp chest pain for 1 day  EXAM: CHEST  2 VIEW  COMPARISON:  None.  FINDINGS: Lungs are clear. Heart size and pulmonary vascularity are normal. No adenopathy. No pneumothorax. No bone lesions. There is slight mid thoracic levoscoliosis.  IMPRESSION: No edema or consolidation.   Electronically Signed   By: Lowella Grip M.D.   On: 02/18/2014 08:39     EKG Interpretation None      Date: 02/18/2014  Rate: 64  Rhythm: normal sinus rhythm  QRS Axis: normal  Intervals: normal  ST/T Wave abnormalities: normal  Conduction Disutrbances:none  Narrative Interpretation:   Old EKG Reviewed: none available    MDM   Final diagnoses:  Chest pain, unspecified chest pain type  Vertigo    SVT and acid reflux presents with chest pain and dizziness. Chest pain: 1 minute episode of sharp left-sided chest pain that did not radiate last night. Self-limited. Never had this chest pain before. No further episodes since last night. Treat of coronary artery disease, cardiac stents. Shortness of breath, cough, fever, palpitations. Dizziness: Began this morning, described as an off-balance feeling. Began after standing up this morning. Mild associated nausea. No history of vertigo, states she thinks this may be vertigo. 's not describe any spinning dizziness. Has associated nausea. Stated, "if I move around, the dizziness will come back".  Here exam benign. Normal gait. Did get nauseated when she got up to walk around, but no further dizziness. Normal finger to nose and heel to shin testing. Normal EKG.  We'll check labs, give meclizine. No neurologic deficits, does not need MRI at this  point.  Feeling better after meclizine. Ambulating with difficulty. Stable for discharge. Instructed to f/u with PCP.  Evelina Bucy, MD 02/18/14 [phone]

## 2014-02-18 NOTE — ED Notes (Signed)
Pt c/o left sided CP last night lasting less than 1 minute now resolved; pt sts dizziness and nausea today

## 2014-02-18 NOTE — ED Notes (Signed)
Pt ambulated in hallway and to bathroom. Pt denies any dizziness stated she was "feeling a lot better." Pt ambulated back to room and placed back on monitor. MD came in while the tech was placing her back on the monitor and she told MD she was feeling a lot better too.

## 2014-06-28 ENCOUNTER — Ambulatory Visit (INDEPENDENT_AMBULATORY_CARE_PROVIDER_SITE_OTHER): Payer: BC Managed Care – PPO

## 2014-06-28 ENCOUNTER — Ambulatory Visit (INDEPENDENT_AMBULATORY_CARE_PROVIDER_SITE_OTHER): Payer: BC Managed Care – PPO | Admitting: Podiatry

## 2014-06-28 ENCOUNTER — Encounter: Payer: Self-pay | Admitting: Podiatry

## 2014-06-28 VITALS — BP 95/66 | HR 69 | Resp 12

## 2014-06-28 DIAGNOSIS — M779 Enthesopathy, unspecified: Secondary | ICD-10-CM

## 2014-06-28 DIAGNOSIS — M79671 Pain in right foot: Secondary | ICD-10-CM

## 2014-06-28 MED ORDER — TRIAMCINOLONE ACETONIDE 10 MG/ML IJ SUSP
10.0000 mg | Freq: Once | INTRAMUSCULAR | Status: AC
  Administered 2014-06-28: 10 mg

## 2014-06-28 NOTE — Progress Notes (Signed)
Subjective:     Patient ID: Kim Yu, female   DOB: 1955-12-31, 59 y.o.   MRN: [id number]  HPI patient states I'm getting a lot of pain in my forefoot right for the last 3 months. I do not remember specific injury but it makes it hard to walk   Review of Systems  All other systems reviewed and are negative.      Objective:   Physical Exam  Constitutional: She is oriented to person, place, and time.  Cardiovascular: Intact distal pulses.   Musculoskeletal: Normal range of motion.  Neurological: She is oriented to person, place, and time.  Skin: Skin is warm.  Nursing note and vitals reviewed.  neurovascular status intact muscle strength adequate with range of motion subtalar midtarsal joint within normal limits. Patient's noted to have forefoot inflammation right with pain around the fourth metatarsophalangeal joint and is noted to have no significant edema in the foot or ankle. Patient has good digital perfusion is well oriented 3     Assessment:     Probable inflammatory capsulitis fourth MPJ right foot versus possible neuroma    Plan:     H&P and x-rays reviewed with patient. Did a proximal nerve block right aspirated the fourth joint and was able to get out a small amount of clear fluid and injected with a quarter cc of dexamethasone Kenalog. I then applied padding to take pressure off the joint and instructed on rigid Yu shoes. Patient will be seen back to recheck in one week

## 2014-06-28 NOTE — Progress Notes (Signed)
   Subjective:    Patient ID: Kim Yu, female    DOB: March 10, 1955, 59 y.o.   MRN: [id number]  HPI  PT RT BALL OF THE FOOT HAVE BURNING SENSATION FOR 3 MONTHS. THE FOOT IS GETTING WORSE WHEN WALKING OR FLEXING THE FOOT. TRIED TO WEAR TENNIS SHOES WITH ORTHOTICS BUT NO HELP.  Review of Systems  Musculoskeletal: Positive for gait problem.       Objective:   Physical Exam        Assessment & Plan:

## 2014-07-12 ENCOUNTER — Encounter: Payer: Self-pay | Admitting: Podiatry

## 2014-07-12 ENCOUNTER — Ambulatory Visit (INDEPENDENT_AMBULATORY_CARE_PROVIDER_SITE_OTHER): Payer: BC Managed Care – PPO | Admitting: Podiatry

## 2014-07-12 VITALS — BP 122/70 | HR 72 | Resp 12

## 2014-07-12 DIAGNOSIS — M779 Enthesopathy, unspecified: Secondary | ICD-10-CM | POA: Diagnosis not present

## 2014-07-12 DIAGNOSIS — M79673 Pain in unspecified foot: Secondary | ICD-10-CM

## 2014-07-12 NOTE — Progress Notes (Signed)
Subjective:     Patient ID: Kim Yu, female   DOB: 07-Jul-1955, 59 y.o.   MRN: [id number]  HPI patient states my right foot is doing quite a bit better with discomfort still if I been on it for a long time   Review of Systems     Objective:   Physical Exam Neurovascular status intact muscle strength adequate with significant diminishment of discomfort fourth metatarsophalangeal joint right foot    Assessment:     Improving capsulitis fourth MPJ right    Plan:     Reviewed physical therapy and supportive therapy for condition along with rigid Yu shoes. Patient will be seen back for [place] to recheck again as needed and may require orthotic treatment

## 2014-07-13 ENCOUNTER — Other Ambulatory Visit: Payer: Self-pay

## 2014-07-13 DIAGNOSIS — Z1231 Encounter for screening mammogram for malignant neoplasm of breast: Secondary | ICD-10-CM

## 2014-10-25 ENCOUNTER — Ambulatory Visit: Payer: BC Managed Care – PPO

## 2014-11-16 ENCOUNTER — Ambulatory Visit
Admission: RE | Admit: 2014-11-16 | Discharge: 2014-11-16 | Disposition: A | Discharge status: Home / Self Care | Payer: BC Managed Care – PPO | Source: Ambulatory Visit

## 2014-11-16 DIAGNOSIS — Z1231 Encounter for screening mammogram for malignant neoplasm of breast: Secondary | ICD-10-CM

## 2015-05-18 ENCOUNTER — Other Ambulatory Visit: Payer: Self-pay | Admitting: Gastroenterology

## 2015-10-27 ENCOUNTER — Telehealth: Payer: Self-pay | Admitting: Internal Medicine

## 2015-10-27 DIAGNOSIS — R002 Palpitations: Secondary | ICD-10-CM
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
## 2015-10-27 NOTE — Telephone Encounter (Signed)
Would think about holter if symptoms daily, otherwise a 30 day recorder Or ziio

## 2015-10-27 NOTE — Telephone Encounter (Signed)
I called and spoke with the patient. She states that for the last 1 & 1/2 weeks that she has been having a fluttering feeling in her chest mostly noted at night when she sits down. She is slightly breathless at times. Her daughter was in a car accident fairly recently, which put her in pre-term labor and she delivered at 8 & 1/2 months. The patient has been helping with the baby now that he is home. She is not sleeping well or eating regularly.  She does not consume caffeine. She did have a SVT ablation done January 2007 with Dr. Caryl Comes. She is slightly concerned about her symptoms. I have advised her that stress can certainly play a part in what she is feeling, and that she may be having some runs of SVT- bearing down will help for a little while with her symptoms.  She does not appear to be in any distress currently. I have advised her that she may follow up with her PCP, but have also offered her a follow up with Dr. Caryl Comes on 11/15/15. I advised her he may want her to wear a holter prior to see assess her rhythm. I will review with Dr. Caryl Comes and call her back. She states she will cancel the follow up with Dr. Caryl Comes if he feels she doesn't need this.

## 2015-10-31 NOTE — Telephone Encounter (Signed)
I left a message for the patient to call on her home and cell #'s.

## 2015-10-31 NOTE — Telephone Encounter (Signed)
I spoke with the patient today.  She is still having fluttering symptoms, but not as frequent. She is agreeable with a 30-day heart monitor. I advised I will order this and that scheduling will call her to arrange. She is agreeable.

## 2015-11-03 ENCOUNTER — Encounter: Payer: Self-pay | Admitting: Internal Medicine

## 2015-11-15 ENCOUNTER — Ambulatory Visit: Payer: BC Managed Care – PPO | Admitting: Internal Medicine

## 2015-11-16 ENCOUNTER — Ambulatory Visit (INDEPENDENT_AMBULATORY_CARE_PROVIDER_SITE_OTHER): Payer: BC Managed Care – PPO

## 2015-11-16 DIAGNOSIS — R002 Palpitations: Secondary | ICD-10-CM | POA: Diagnosis not present

## 2015-11-17 ENCOUNTER — Telehealth: Payer: Self-pay | Admitting: Internal Medicine

## 2015-11-17 NOTE — Telephone Encounter (Signed)
Called patient back and explained it will beep when she is not wearing it. I explained if she turns the cell phone off the whole system will have to be reset. So unfortunately you just have to deal with the beeps while showering.

## 2015-11-17 NOTE — Telephone Encounter (Signed)
Will forward to Katy/ Shelly in the holter room.

## 2015-11-17 NOTE — Telephone Encounter (Signed)
New Message  Pt call requesting to speak with RN. Pt states she wanted to know if she would need to turn the monitor off when bathing. Pt states monitor beeps. Please call back to discuss

## 2015-11-28 ENCOUNTER — Other Ambulatory Visit: Payer: Self-pay | Admitting: Obstetrics and Gynecology

## 2015-11-29 ENCOUNTER — Other Ambulatory Visit: Payer: Self-pay | Admitting: Obstetrics and Gynecology

## 2015-11-29 DIAGNOSIS — M8580 Other specified disorders of bone density and structure, unspecified site: Secondary | ICD-10-CM

## 2015-11-29 DIAGNOSIS — Z1231 Encounter for screening mammogram for malignant neoplasm of breast: Secondary | ICD-10-CM

## 2015-12-20 ENCOUNTER — Encounter (INDEPENDENT_AMBULATORY_CARE_PROVIDER_SITE_OTHER): Payer: Self-pay

## 2015-12-20 ENCOUNTER — Ambulatory Visit (INDEPENDENT_AMBULATORY_CARE_PROVIDER_SITE_OTHER): Payer: BC Managed Care – PPO | Admitting: Internal Medicine

## 2015-12-20 ENCOUNTER — Encounter: Payer: Self-pay | Admitting: Internal Medicine

## 2015-12-20 VITALS — BP 110/68 | HR 78 | Ht 66.0 in | Wt 174.2 lb

## 2015-12-20 DIAGNOSIS — I471 Supraventricular tachycardia: Secondary | ICD-10-CM

## 2015-12-20 NOTE — Progress Notes (Signed)
For      Kim Yu  Patient ID: Kim Yu, MRN: [id number], DOB/AGE: Feb 14, 1955 60 y.o. Admit date: (Not on file) Date of Consult: 12/20/2015  Primary Physician: Kim Schwalbe, MD Primary Cardiologist: *new Consulting Physician none  Chief Complaint: palpitations   HPI Kim Yu is a 60 y.o. female  Seen after a hiatus of about 5 years. She is a history of atrial tachycardia for which she underwent ablation 2007.    More recently, her daughter was involved in a car accident causing preterm labor. She started having complaints of shortness of breath and fluttering.   Kim Yu been very stressful in terms of caringfor her grandson.     She was given a 30 day monitor. Symptoms of fluttering were associated with PVCs and a 4 beat run of nonsustained atrial tachycardia          Past Medical History:  Diagnosis Date  . Acid reflux   . Hiatal hernia   . Migraines   . Ovarian cyst   . Premature ovarian failure   . SVT (supraventricular tachycardia) [facility])       Surgical History:  Past Surgical History:  Procedure Laterality Date  . CARDIAC ELECTROPHYSIOLOGY STUDY AND ABLATION    . OOPHORECTOMY     LSO  . SHOULDER SURGERY    . TEMPOROMANDIBULAR JOINT SURGERY       Home Meds: Prior to Admission medications   Medication Sig Start Date End Date Taking? Authorizing Provider  aspirin 81 MG tablet Take 81 mg by mouth daily.    Historical Provider, MD  Calcium Carb-Cholecalciferol (CALCIUM + D3 PO) Take by mouth daily.    Historical Provider, MD  Famotidine (PEPCID PO) Take 40 mg by mouth daily.     Historical Provider, MD  Fish Oil OIL by Does not apply route daily.    Historical Provider, MD  Naproxen Sodium (ALEVE PO) Take 440 mg by mouth as needed (pain).     Historical Provider, MD    Allergies:  Allergies  Allergen Reactions  . Compazine [Prochlorperazine Edisylate]   . Darvon [Propoxyphene Hcl]   . Pantoprazole     Social History    Social History  . Marital status: Married    Spouse name: N/A  . Number of children: N/A  . Years of education: N/A   Occupational History  . Not on file.   Social History Main Topics  . Smoking status: Never Smoker  . Smokeless tobacco: Never Used  . Alcohol use 0.0 oz/week     Comment: rare  . Drug use: No  . Sexual activity: Yes    Birth control/ protection: Post-menopausal   Other Topics Concern  . Not on file   Social History Narrative  . No narrative on file     Family History  Problem Relation Age of Onset  . Hypertension Mother   . Diabetes Mother   . Breast cancer Mother     Age 23  . Hypertension Father   . Lymphoma Maternal Aunt   . Ovarian cancer Maternal Aunt     Kim Yu  . Breast cancer Paternal Grandmother     Kim Yu     ROS:  Please see the history of present illness.     All other systems reviewed and negative.    Physical Exam:  Blood pressure 110/68, pulse 78, height 5\' 6"  (1.676 m), weight 174 lb 3.2 oz (79 kg), SpO2 95 %. General: Well  developed, well nourished female in no acute distress. Head: Normocephalic, atraumatic, sclera non-icteric, no xanthomas, nares are without discharge. EENT: normal  Lymph Nodes:  none Neck: Negative for carotid bruits. JVD not elevated. Back:without scoliosis kyphosis Lungs: Clear bilaterally to auscultation without wheezes, rales, or rhonchi. Breathing is unlabored. Heart: RRR with S1 S2. No  murmur . No rubs, or gallops appreciated. Abdomen: Soft, non-tender, non-distended with normoactive bowel sounds. No hepatomegaly. No rebound/guarding. No obvious abdominal masses. Msk:  Strength and tone appear normal for age. Extremities: No clubbing or cyanosis. No edema.  Distal pedal pulses are 2+ and equal bilaterally. Skin: Warm and Dry Neuro: Alert and oriented X 3. CN III-XII intact Grossly normal sensory and motor function . Psych:  Responds to questions appropriately with a normal  affect.      Labs: Cardiac Enzymes No results for input(s): CKTOTAL, CKMB, TROPONINI in the last 72 hours. CBC Lab Results  Component Value Date   WBC 4.4 02/18/2014   HGB 13.3 02/18/2014   HCT 39.6 02/18/2014   MCV 90.0 02/18/2014   PLT 244 02/18/2014   PROTIME: No results for input(s): LABPROT, INR in the last 72 hours. Chemistry No results for input(s): NA, K, CL, CO2, BUN, CREATININE, CALCIUM, PROT, BILITOT, ALKPHOS, ALT, AST, GLUCOSE in the last 168 hours.  Invalid input(s): LABALBU Lipids No results found for: CHOL, HDL, LDLCALC, TRIG BNP No results found for: PROBNP Thyroid Function Tests: No results for input(s): TSH, T4TOTAL, T3FREE, THYROIDAB in the last 72 hours.  Invalid input(s): FREET3 Miscellaneous No results found for: DDIMER  Radiology/Studies:  No results found.  EKG: sinus with normal intervals  Event recorder  symptons assoc with PVCs and nonsustained atrial tach   Assessment and Plan:  Palpitations  Recorder shows only infreq PVC and non sustained atrial tach Reassured her  Have encouraged her to take care of herself as she helps to take care of her premature grandson.      Kim Yu

## 2015-12-20 NOTE — Patient Instructions (Addendum)
Medication Instructions: - Your physician recommends that you continue on your current medications as directed. Please refer to the Current Medication list given to you today.  Labwork: - none ordered  Procedures/Testing: - none ordered  Follow-Up: - Dr. Klein will see you back on an as needed basis.  Any Additional Special Instructions Will Be Listed Below (If Applicable).     If you need a refill on your cardiac medications before your next appointment, please call your pharmacy.   

## 2016-01-03 ENCOUNTER — Telehealth: Payer: Self-pay

## 2016-01-03 NOTE — Telephone Encounter (Signed)
Left message to call back with monitor results.

## 2016-01-05 ENCOUNTER — Ambulatory Visit
Admission: RE | Admit: 2016-01-05 | Discharge: 2016-01-05 | Disposition: A | Discharge status: Home / Self Care | Payer: BC Managed Care – PPO | Source: Ambulatory Visit | Attending: Obstetrics and Gynecology | Admitting: Obstetrics and Gynecology

## 2016-01-05 DIAGNOSIS — Z1231 Encounter for screening mammogram for malignant neoplasm of breast: Secondary | ICD-10-CM

## 2016-01-05 DIAGNOSIS — M8580 Other specified disorders of bone density and structure, unspecified site: Secondary | ICD-10-CM

## 2016-12-21 ENCOUNTER — Other Ambulatory Visit: Payer: Self-pay | Admitting: Family Medicine

## 2016-12-21 DIAGNOSIS — Z1231 Encounter for screening mammogram for malignant neoplasm of breast: Secondary | ICD-10-CM

## 2017-01-28 ENCOUNTER — Inpatient Hospital Stay: Admission: RE | Admit: 2017-01-28 | Payer: BC Managed Care – PPO | Source: Ambulatory Visit

## 2017-01-29 ENCOUNTER — Ambulatory Visit
Admission: RE | Admit: 2017-01-29 | Discharge: 2017-01-29 | Disposition: A | Discharge status: Home / Self Care | Payer: BC Managed Care – PPO | Source: Ambulatory Visit | Attending: Family Medicine | Admitting: Family Medicine

## 2017-01-29 DIAGNOSIS — Z1231 Encounter for screening mammogram for malignant neoplasm of breast: Secondary | ICD-10-CM

## 2017-05-10 ENCOUNTER — Ambulatory Visit: Payer: BC Managed Care – PPO | Admitting: Podiatry

## 2018-05-16 ENCOUNTER — Other Ambulatory Visit: Payer: Self-pay | Admitting: Family Medicine

## 2018-05-16 DIAGNOSIS — Z1231 Encounter for screening mammogram for malignant neoplasm of breast: Secondary | ICD-10-CM

## 2018-07-11 ENCOUNTER — Ambulatory Visit
Admission: RE | Admit: 2018-07-11 | Discharge: 2018-07-11 | Disposition: A | Discharge status: Home / Self Care | Payer: BC Managed Care – PPO | Source: Ambulatory Visit | Attending: Family Medicine | Admitting: Family Medicine

## 2018-07-11 DIAGNOSIS — Z1231 Encounter for screening mammogram for malignant neoplasm of breast: Secondary | ICD-10-CM

## 2018-11-17 ENCOUNTER — Ambulatory Visit
Admission: RE | Admit: 2018-11-17 | Discharge: 2018-11-17 | Disposition: A | Discharge status: Home / Self Care | Payer: Medicare Other | Source: Ambulatory Visit | Attending: Physician Assistant | Admitting: Physician Assistant

## 2018-11-17 ENCOUNTER — Other Ambulatory Visit: Payer: Self-pay

## 2018-11-17 DIAGNOSIS — M79605 Pain in left leg: Secondary | ICD-10-CM

## 2019-03-12 ENCOUNTER — Ambulatory Visit: Payer: Medicare Other

## 2019-06-26 ENCOUNTER — Other Ambulatory Visit: Payer: Self-pay | Admitting: Family Medicine

## 2019-06-26 DIAGNOSIS — Z1231 Encounter for screening mammogram for malignant neoplasm of breast: Secondary | ICD-10-CM

## 2019-07-15 ENCOUNTER — Ambulatory Visit
Admission: RE | Admit: 2019-07-15 | Discharge: 2019-07-15 | Disposition: A | Discharge status: Home / Self Care | Payer: Medicare Other | Source: Ambulatory Visit | Attending: Family Medicine | Admitting: Family Medicine

## 2019-07-15 ENCOUNTER — Other Ambulatory Visit: Payer: Self-pay

## 2019-07-15 DIAGNOSIS — Z1231 Encounter for screening mammogram for malignant neoplasm of breast: Secondary | ICD-10-CM

## 2019-09-04 DIAGNOSIS — M25552 Pain in left hip: Secondary | ICD-10-CM | POA: Diagnosis not present

## 2019-09-25 DIAGNOSIS — Z01419 Encounter for gynecological examination (general) (routine) without abnormal findings: Secondary | ICD-10-CM | POA: Diagnosis not present

## 2019-09-25 DIAGNOSIS — Z6828 Body mass index (BMI) 28.0-28.9, adult: Secondary | ICD-10-CM | POA: Diagnosis not present

## 2019-09-25 DIAGNOSIS — L900 Lichen sclerosus et atrophicus: Secondary | ICD-10-CM | POA: Diagnosis not present

## 2019-09-28 DIAGNOSIS — F411 Generalized anxiety disorder: Secondary | ICD-10-CM | POA: Diagnosis not present

## 2019-09-30 DIAGNOSIS — M25552 Pain in left hip: Secondary | ICD-10-CM | POA: Diagnosis not present

## 2019-11-12 DIAGNOSIS — F411 Generalized anxiety disorder: Secondary | ICD-10-CM | POA: Diagnosis not present

## 2019-12-01 DIAGNOSIS — L219 Seborrheic dermatitis, unspecified: Secondary | ICD-10-CM | POA: Diagnosis not present

## 2019-12-01 DIAGNOSIS — L650 Telogen effluvium: Secondary | ICD-10-CM | POA: Diagnosis not present

## 2020-04-13 DIAGNOSIS — Z0000 Encounter for general adult medical examination without abnormal findings: Secondary | ICD-10-CM | POA: Diagnosis not present

## 2020-04-13 DIAGNOSIS — Z23 Encounter for immunization: Secondary | ICD-10-CM | POA: Diagnosis not present

## 2020-04-13 DIAGNOSIS — M8588 Other specified disorders of bone density and structure, other site: Secondary | ICD-10-CM | POA: Diagnosis not present

## 2020-04-13 DIAGNOSIS — F411 Generalized anxiety disorder: Secondary | ICD-10-CM | POA: Diagnosis not present

## 2020-04-26 DIAGNOSIS — Z20828 Contact with and (suspected) exposure to other viral communicable diseases: Secondary | ICD-10-CM | POA: Diagnosis not present

## 2020-04-27 DIAGNOSIS — J069 Acute upper respiratory infection, unspecified: Secondary | ICD-10-CM | POA: Diagnosis not present

## 2020-05-17 ENCOUNTER — Other Ambulatory Visit: Payer: Self-pay

## 2020-05-17 ENCOUNTER — Other Ambulatory Visit (HOSPITAL_BASED_OUTPATIENT_CLINIC_OR_DEPARTMENT_OTHER): Payer: Self-pay

## 2020-05-17 ENCOUNTER — Ambulatory Visit: Payer: Medicare PPO | Attending: Internal Medicine

## 2020-05-17 DIAGNOSIS — Z23 Encounter for immunization: Secondary | ICD-10-CM

## 2020-05-17 MED ORDER — COVID-19 MRNA VACC (MODERNA) 100 MCG/0.5ML IM SUSP
INTRAMUSCULAR | 0 refills | Status: AC
  Filled 2020-05-17: qty 0.25, 1d supply, fill #0

## 2020-05-17 NOTE — Progress Notes (Signed)
   Covid-19 Vaccination Clinic  Name:  GAELYN TUKES    MRN: [id number] DOB: 05/07/1955  05/17/2020  Ms. Hinger was observed post Covid-19 immunization for 15 minutes without incident. She was provided with Vaccine Information Sheet and instruction to access the V-Safe system.   Ms. Stoermer was instructed to call 911 with any severe reactions post vaccine: Marland Kitchen Difficulty breathing  . Swelling of face and throat  . A fast heartbeat  . A bad rash all over body  . Dizziness and weakness   Immunizations Administered    Name Date Dose VIS Date Route   Moderna Covid-19 Booster Vaccine 05/17/2020  1:23 PM 0.25 mL 11/04/2019 Intramuscular   Manufacturer: Moderna   Lot: [id number]   [place]: 21115-520-80

## 2020-05-30 DIAGNOSIS — J0110 Acute frontal sinusitis, unspecified: Secondary | ICD-10-CM | POA: Diagnosis not present

## 2020-07-05 ENCOUNTER — Other Ambulatory Visit: Payer: Self-pay | Admitting: Family Medicine

## 2020-07-05 DIAGNOSIS — Z1231 Encounter for screening mammogram for malignant neoplasm of breast: Secondary | ICD-10-CM

## 2020-07-05 DIAGNOSIS — M8580 Other specified disorders of bone density and structure, unspecified site: Secondary | ICD-10-CM

## 2020-07-07 DIAGNOSIS — U071 COVID-19: Secondary | ICD-10-CM | POA: Diagnosis not present

## 2020-07-07 DIAGNOSIS — R21 Rash and other nonspecific skin eruption: Secondary | ICD-10-CM | POA: Diagnosis not present

## 2020-07-29 DIAGNOSIS — M542 Cervicalgia: Secondary | ICD-10-CM | POA: Diagnosis not present

## 2020-07-29 DIAGNOSIS — M25511 Pain in right shoulder: Secondary | ICD-10-CM | POA: Diagnosis not present

## 2020-08-01 ENCOUNTER — Ambulatory Visit
Admission: RE | Admit: 2020-08-01 | Discharge: 2020-08-01 | Disposition: A | Discharge status: Home / Self Care | Payer: Medicare PPO | Source: Ambulatory Visit | Attending: Family Medicine | Admitting: Family Medicine

## 2020-08-01 ENCOUNTER — Other Ambulatory Visit: Payer: Self-pay

## 2020-08-01 DIAGNOSIS — M8580 Other specified disorders of bone density and structure, unspecified site: Secondary | ICD-10-CM

## 2020-08-01 DIAGNOSIS — M8588 Other specified disorders of bone density and structure, other site: Secondary | ICD-10-CM | POA: Diagnosis not present

## 2020-08-01 DIAGNOSIS — Z1231 Encounter for screening mammogram for malignant neoplasm of breast: Secondary | ICD-10-CM

## 2020-08-01 DIAGNOSIS — Z780 Asymptomatic menopausal state: Secondary | ICD-10-CM | POA: Diagnosis not present

## 2020-08-08 DIAGNOSIS — M25511 Pain in right shoulder: Secondary | ICD-10-CM | POA: Diagnosis not present

## 2020-08-08 DIAGNOSIS — R531 Weakness: Secondary | ICD-10-CM | POA: Diagnosis not present

## 2020-08-08 DIAGNOSIS — M7541 Impingement syndrome of right shoulder: Secondary | ICD-10-CM | POA: Diagnosis not present

## 2020-08-11 DIAGNOSIS — R531 Weakness: Secondary | ICD-10-CM | POA: Diagnosis not present

## 2020-08-11 DIAGNOSIS — M25511 Pain in right shoulder: Secondary | ICD-10-CM | POA: Diagnosis not present

## 2020-08-11 DIAGNOSIS — M7541 Impingement syndrome of right shoulder: Secondary | ICD-10-CM | POA: Diagnosis not present

## 2020-08-16 DIAGNOSIS — M25511 Pain in right shoulder: Secondary | ICD-10-CM | POA: Diagnosis not present

## 2020-08-16 DIAGNOSIS — M7541 Impingement syndrome of right shoulder: Secondary | ICD-10-CM | POA: Diagnosis not present

## 2020-08-16 DIAGNOSIS — R531 Weakness: Secondary | ICD-10-CM | POA: Diagnosis not present

## 2020-08-18 DIAGNOSIS — R531 Weakness: Secondary | ICD-10-CM | POA: Diagnosis not present

## 2020-08-18 DIAGNOSIS — M25511 Pain in right shoulder: Secondary | ICD-10-CM | POA: Diagnosis not present

## 2020-08-18 DIAGNOSIS — M7541 Impingement syndrome of right shoulder: Secondary | ICD-10-CM | POA: Diagnosis not present

## 2020-08-30 DIAGNOSIS — M25511 Pain in right shoulder: Secondary | ICD-10-CM | POA: Diagnosis not present

## 2020-08-30 DIAGNOSIS — M7541 Impingement syndrome of right shoulder: Secondary | ICD-10-CM | POA: Diagnosis not present

## 2020-08-30 DIAGNOSIS — R531 Weakness: Secondary | ICD-10-CM | POA: Diagnosis not present

## 2020-08-31 DIAGNOSIS — M7541 Impingement syndrome of right shoulder: Secondary | ICD-10-CM | POA: Diagnosis not present

## 2020-08-31 DIAGNOSIS — M25511 Pain in right shoulder: Secondary | ICD-10-CM | POA: Diagnosis not present

## 2020-09-01 DIAGNOSIS — M7541 Impingement syndrome of right shoulder: Secondary | ICD-10-CM | POA: Diagnosis not present

## 2020-09-01 DIAGNOSIS — M25511 Pain in right shoulder: Secondary | ICD-10-CM | POA: Diagnosis not present

## 2020-09-01 DIAGNOSIS — R531 Weakness: Secondary | ICD-10-CM | POA: Diagnosis not present

## 2020-09-02 DIAGNOSIS — H5203 Hypermetropia, bilateral: Secondary | ICD-10-CM | POA: Diagnosis not present

## 2020-09-02 DIAGNOSIS — H2513 Age-related nuclear cataract, bilateral: Secondary | ICD-10-CM | POA: Diagnosis not present

## 2020-09-06 DIAGNOSIS — R531 Weakness: Secondary | ICD-10-CM | POA: Diagnosis not present

## 2020-09-06 DIAGNOSIS — M7541 Impingement syndrome of right shoulder: Secondary | ICD-10-CM | POA: Diagnosis not present

## 2020-09-06 DIAGNOSIS — M25511 Pain in right shoulder: Secondary | ICD-10-CM | POA: Diagnosis not present

## 2020-09-08 DIAGNOSIS — K219 Gastro-esophageal reflux disease without esophagitis: Secondary | ICD-10-CM | POA: Diagnosis not present

## 2020-09-08 DIAGNOSIS — Z86010 Personal history of colonic polyps: Secondary | ICD-10-CM | POA: Diagnosis not present

## 2020-09-08 DIAGNOSIS — M25511 Pain in right shoulder: Secondary | ICD-10-CM | POA: Diagnosis not present

## 2020-09-08 DIAGNOSIS — M7541 Impingement syndrome of right shoulder: Secondary | ICD-10-CM | POA: Diagnosis not present

## 2020-09-08 DIAGNOSIS — R531 Weakness: Secondary | ICD-10-CM | POA: Diagnosis not present

## 2020-09-21 DIAGNOSIS — L821 Other seborrheic keratosis: Secondary | ICD-10-CM | POA: Diagnosis not present

## 2020-09-21 DIAGNOSIS — L219 Seborrheic dermatitis, unspecified: Secondary | ICD-10-CM | POA: Diagnosis not present

## 2020-09-21 DIAGNOSIS — L650 Telogen effluvium: Secondary | ICD-10-CM | POA: Diagnosis not present

## 2020-11-01 DIAGNOSIS — Z124 Encounter for screening for malignant neoplasm of cervix: Secondary | ICD-10-CM | POA: Diagnosis not present

## 2020-11-01 DIAGNOSIS — L900 Lichen sclerosus et atrophicus: Secondary | ICD-10-CM | POA: Diagnosis not present

## 2020-11-01 DIAGNOSIS — Z6829 Body mass index (BMI) 29.0-29.9, adult: Secondary | ICD-10-CM | POA: Diagnosis not present

## 2020-11-18 ENCOUNTER — Encounter: Payer: Self-pay | Admitting: Podiatry

## 2020-11-18 ENCOUNTER — Ambulatory Visit (INDEPENDENT_AMBULATORY_CARE_PROVIDER_SITE_OTHER): Payer: Medicare PPO

## 2020-11-18 ENCOUNTER — Ambulatory Visit: Payer: Medicare PPO | Admitting: Podiatry

## 2020-11-18 ENCOUNTER — Other Ambulatory Visit: Payer: Self-pay

## 2020-11-18 DIAGNOSIS — M21611 Bunion of right foot: Secondary | ICD-10-CM | POA: Diagnosis not present

## 2020-11-18 DIAGNOSIS — M21619 Bunion of unspecified foot: Secondary | ICD-10-CM

## 2020-11-18 DIAGNOSIS — M79672 Pain in left foot: Secondary | ICD-10-CM

## 2020-11-18 DIAGNOSIS — M778 Other enthesopathies, not elsewhere classified: Secondary | ICD-10-CM | POA: Diagnosis not present

## 2020-11-18 MED ORDER — TRIAMCINOLONE ACETONIDE 10 MG/ML IJ SUSP
10.0000 mg | Freq: Once | INTRAMUSCULAR | Status: AC
  Administered 2020-11-18: 10 mg

## 2020-11-21 NOTE — Progress Notes (Signed)
Subjective:   Patient ID: Kim Yu, female   DOB: 65 y.o.   MRN: [id number]   HPI Patient presents with pain around the lesser joint right stating its been sore and generalized pain with history of bunion deformity and needs new orthotics at the current time.  States that it gets sore makes it hard for her to walk on the right foot and patient does not smoke likes to be active   Review of Systems  All other systems reviewed and are negative.      Objective:  Physical Exam Vitals and nursing note reviewed.  Constitutional:      Appearance: She is well-developed.  Pulmonary:     Effort: Pulmonary effort is normal.  Musculoskeletal:        General: Normal range of motion.  Skin:    General: Skin is warm.  Neurological:     Mental Status: She is alert.    Neurovascular status intact muscle strength adequate range of motion adequate with exquisite discomfort second metatarsal phalangeal joint right inflammation fluid around the joint with structural bunion deformity history of surgery and moderate depression of the arch noted bilateral with good digital fusion well oriented x3     Assessment:  Inflammatory capsulitis second MPJ right moderate bunion deformity and flatfoot deformity     Plan:  H&P reviewed all conditions and for the right I did do sterile prep aspirated the second MPJ getting out a small amount of clear fluid injected quarter cc dexamethasone Kenalog and casted for functional orthotic devices to reduce stress on her feet.  Patient will be seen back to recheck  X-rays indicate mild osteoporosis moderate depression of the arch bilateral no indication of fracture or acute arthritic condition with moderate bunion deformity

## 2020-12-19 ENCOUNTER — Other Ambulatory Visit: Payer: Self-pay

## 2020-12-19 ENCOUNTER — Ambulatory Visit (INDEPENDENT_AMBULATORY_CARE_PROVIDER_SITE_OTHER): Payer: Medicare PPO

## 2020-12-19 DIAGNOSIS — M778 Other enthesopathies, not elsewhere classified: Secondary | ICD-10-CM

## 2020-12-19 NOTE — Progress Notes (Signed)
SITUATION: Reason for Visit: Fitting and Delivery of Custom Fabricated Foot Orthoses Patient Report: Patient reports comfort and is satisfied with device.  OBJECTIVE DATA: Patient History / Diagnosis:  No change in pathology Provided Device:  2x pair custom functional foot orthoses  GOAL OF ORTHOSIS - Improve gait - Decrease energy expenditure - Improve Balance - Provide Triplanar stability of foot complex - Facilitate motion  ACTIONS PERFORMED Patient was fit with foot orthoses trimmed to shoe last. Patient tolerated fittign procedure. Device was modified as follows to better fit patient: - Toe plate was trimmed to shoe last  Patient also wishes to resurface her old pair  Patient was provided with verbal and written instruction and demonstration regarding donning, doffing, wear, care, proper fit, function, purpose, cleaning, and use of the orthosis and in all related precautions and risks and benefits regarding the orthosis.  Patient was also provided with verbal instruction regarding how to report any failures or malfunctions of the orthosis and necessary follow up care. Patient was also instructed to contact our office regarding any change in status that may affect the function of the orthosis.  Patient demonstrated independence with proper donning, doffing, and fit and verbalized understanding of all instructions.  PLAN: Patient to be contacted when refurbishment complete. All questions were answered and concerns addressed. Plan of care was discussed with and agreed upon by the patient.

## 2020-12-21 DIAGNOSIS — J111 Influenza due to unidentified influenza virus with other respiratory manifestations: Secondary | ICD-10-CM | POA: Diagnosis not present

## 2020-12-30 DIAGNOSIS — Z01812 Encounter for preprocedural laboratory examination: Secondary | ICD-10-CM | POA: Diagnosis not present

## 2020-12-30 DIAGNOSIS — Z03818 Encounter for observation for suspected exposure to other biological agents ruled out: Secondary | ICD-10-CM | POA: Diagnosis not present

## 2021-01-02 DIAGNOSIS — J209 Acute bronchitis, unspecified: Secondary | ICD-10-CM | POA: Diagnosis not present

## 2021-01-23 DIAGNOSIS — J0140 Acute pansinusitis, unspecified: Secondary | ICD-10-CM | POA: Diagnosis not present

## 2021-02-01 ENCOUNTER — Telehealth: Payer: Self-pay | Admitting: Podiatry

## 2021-02-01 NOTE — Telephone Encounter (Signed)
Refurbished orthotics in.. pt aware ok to pick up. 

## 2021-02-23 DIAGNOSIS — K5730 Diverticulosis of large intestine without perforation or abscess without bleeding: Secondary | ICD-10-CM | POA: Diagnosis not present

## 2021-02-23 DIAGNOSIS — K2950 Unspecified chronic gastritis without bleeding: Secondary | ICD-10-CM | POA: Diagnosis not present

## 2021-02-23 DIAGNOSIS — K2100 Gastro-esophageal reflux disease with esophagitis, without bleeding: Secondary | ICD-10-CM | POA: Diagnosis not present

## 2021-02-23 DIAGNOSIS — K449 Diaphragmatic hernia without obstruction or gangrene: Secondary | ICD-10-CM | POA: Diagnosis not present

## 2021-02-23 DIAGNOSIS — K649 Unspecified hemorrhoids: Secondary | ICD-10-CM | POA: Diagnosis not present

## 2021-02-23 DIAGNOSIS — Z86010 Personal history of colonic polyps: Secondary | ICD-10-CM | POA: Diagnosis not present

## 2021-05-22 DIAGNOSIS — F411 Generalized anxiety disorder: Secondary | ICD-10-CM | POA: Diagnosis not present

## 2021-05-22 DIAGNOSIS — J4520 Mild intermittent asthma, uncomplicated: Secondary | ICD-10-CM | POA: Diagnosis not present

## 2021-05-22 DIAGNOSIS — E785 Hyperlipidemia, unspecified: Secondary | ICD-10-CM | POA: Diagnosis not present

## 2021-05-22 DIAGNOSIS — Z0000 Encounter for general adult medical examination without abnormal findings: Secondary | ICD-10-CM | POA: Diagnosis not present

## 2021-05-22 DIAGNOSIS — K219 Gastro-esophageal reflux disease without esophagitis: Secondary | ICD-10-CM | POA: Diagnosis not present

## 2021-05-22 DIAGNOSIS — J309 Allergic rhinitis, unspecified: Secondary | ICD-10-CM | POA: Diagnosis not present

## 2021-06-07 DIAGNOSIS — L900 Lichen sclerosus et atrophicus: Secondary | ICD-10-CM | POA: Diagnosis not present

## 2021-06-07 DIAGNOSIS — L218 Other seborrheic dermatitis: Secondary | ICD-10-CM | POA: Diagnosis not present

## 2021-06-20 ENCOUNTER — Other Ambulatory Visit: Payer: Self-pay | Admitting: Family Medicine

## 2021-06-20 DIAGNOSIS — Z1231 Encounter for screening mammogram for malignant neoplasm of breast: Secondary | ICD-10-CM

## 2021-08-02 ENCOUNTER — Ambulatory Visit
Admission: RE | Admit: 2021-08-02 | Discharge: 2021-08-02 | Disposition: A | Discharge status: Home / Self Care | Payer: Medicare PPO | Source: Ambulatory Visit | Attending: Family Medicine | Admitting: Family Medicine

## 2021-08-02 DIAGNOSIS — Z1231 Encounter for screening mammogram for malignant neoplasm of breast: Secondary | ICD-10-CM

## 2021-09-07 DIAGNOSIS — G5711 Meralgia paresthetica, right lower limb: Secondary | ICD-10-CM | POA: Diagnosis not present

## 2021-09-21 DIAGNOSIS — H524 Presbyopia: Secondary | ICD-10-CM | POA: Diagnosis not present

## 2021-09-21 DIAGNOSIS — H5203 Hypermetropia, bilateral: Secondary | ICD-10-CM | POA: Diagnosis not present

## 2021-09-21 DIAGNOSIS — H2513 Age-related nuclear cataract, bilateral: Secondary | ICD-10-CM | POA: Diagnosis not present

## 2021-10-03 DIAGNOSIS — J019 Acute sinusitis, unspecified: Secondary | ICD-10-CM | POA: Diagnosis not present

## 2021-10-03 DIAGNOSIS — J4520 Mild intermittent asthma, uncomplicated: Secondary | ICD-10-CM | POA: Diagnosis not present

## 2021-10-11 IMAGING — MG MM DIGITAL SCREENING BILAT W/ TOMO AND CAD
6 of 10 series · 6 of 30 positions shown · non-contrast
Comparison: Previous exam(s).

CLINICAL DATA: Screening.

EXAM:
DIGITAL SCREENING BILATERAL MAMMOGRAM WITH TOMOSYNTHESIS AND CAD
TECHNIQUE: Bilateral screening digital craniocaudal and mediolateral oblique
mammograms were obtained. Bilateral screening digital breast
tomosynthesis was performed. The images were evaluated with
computer-aided detection.

[R CC synth-2D (1 of 2)]
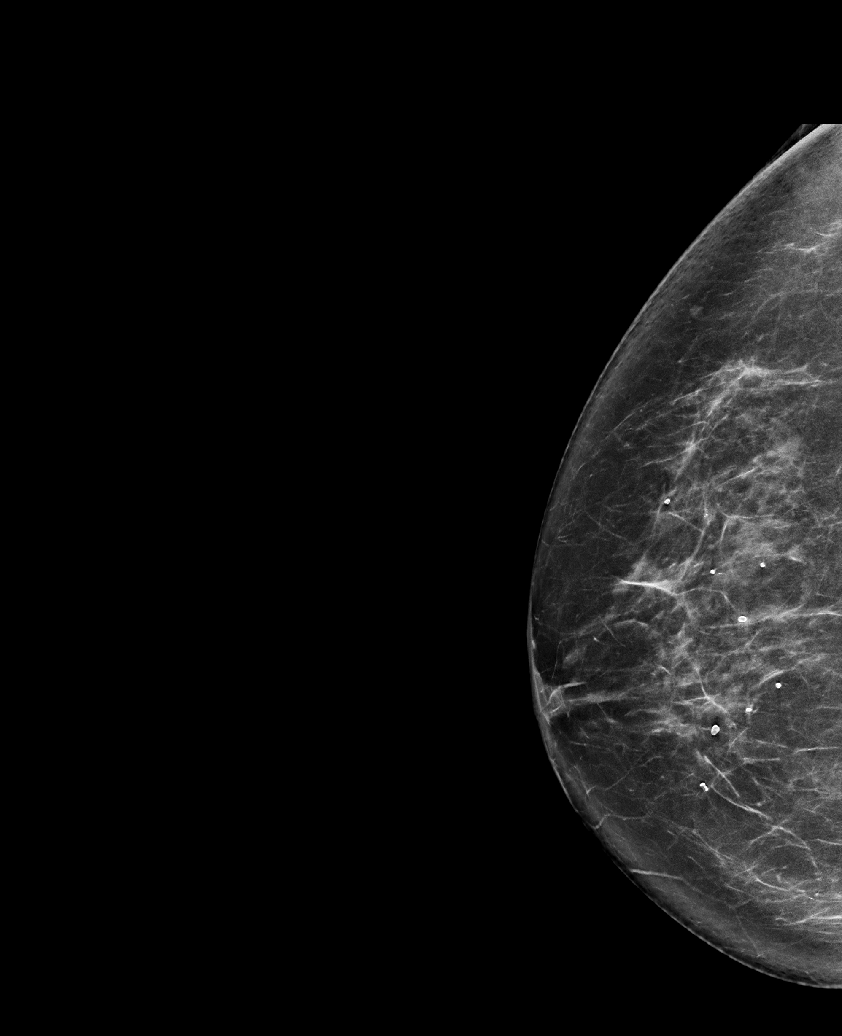

[L MLO synth-2D]
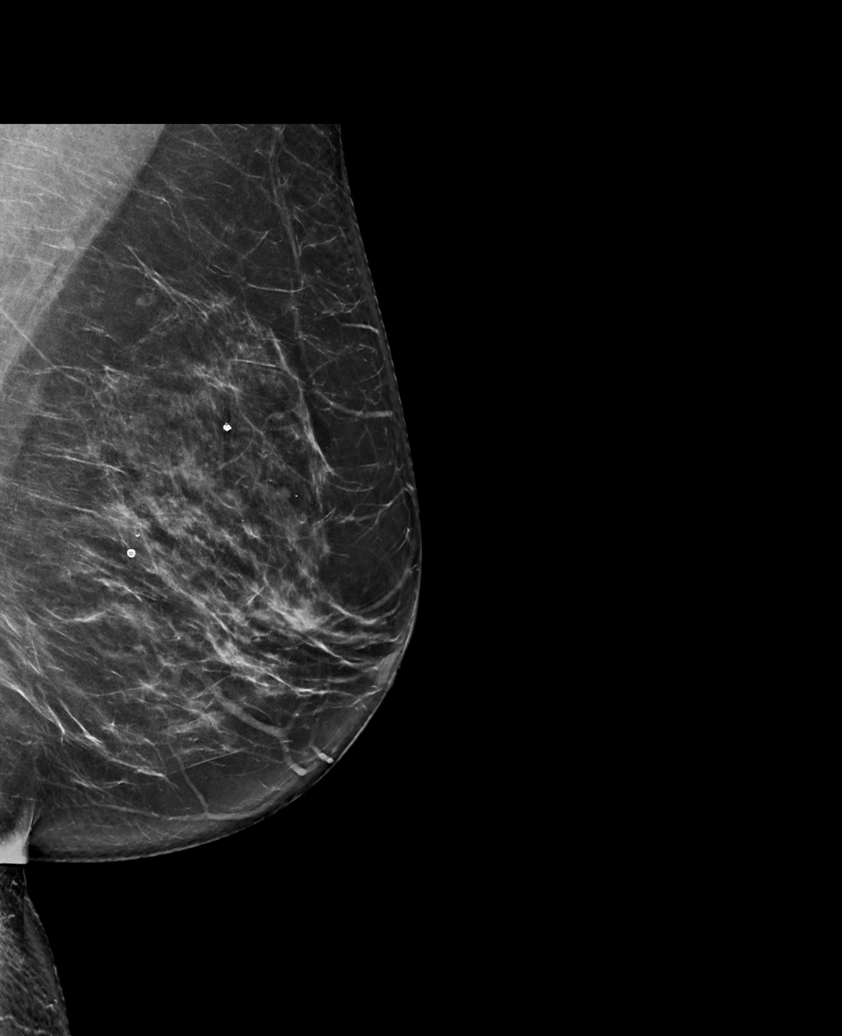

[R CC synth-2D (2 of 2)]
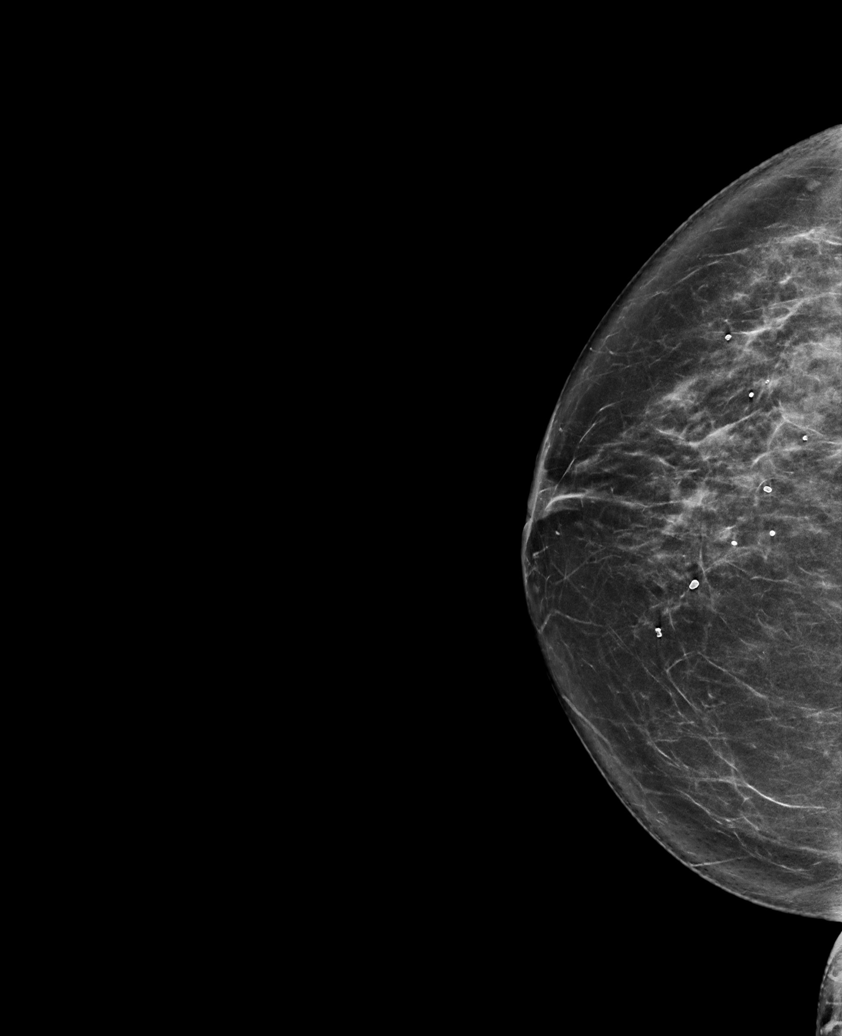

[L CC synth-2D]
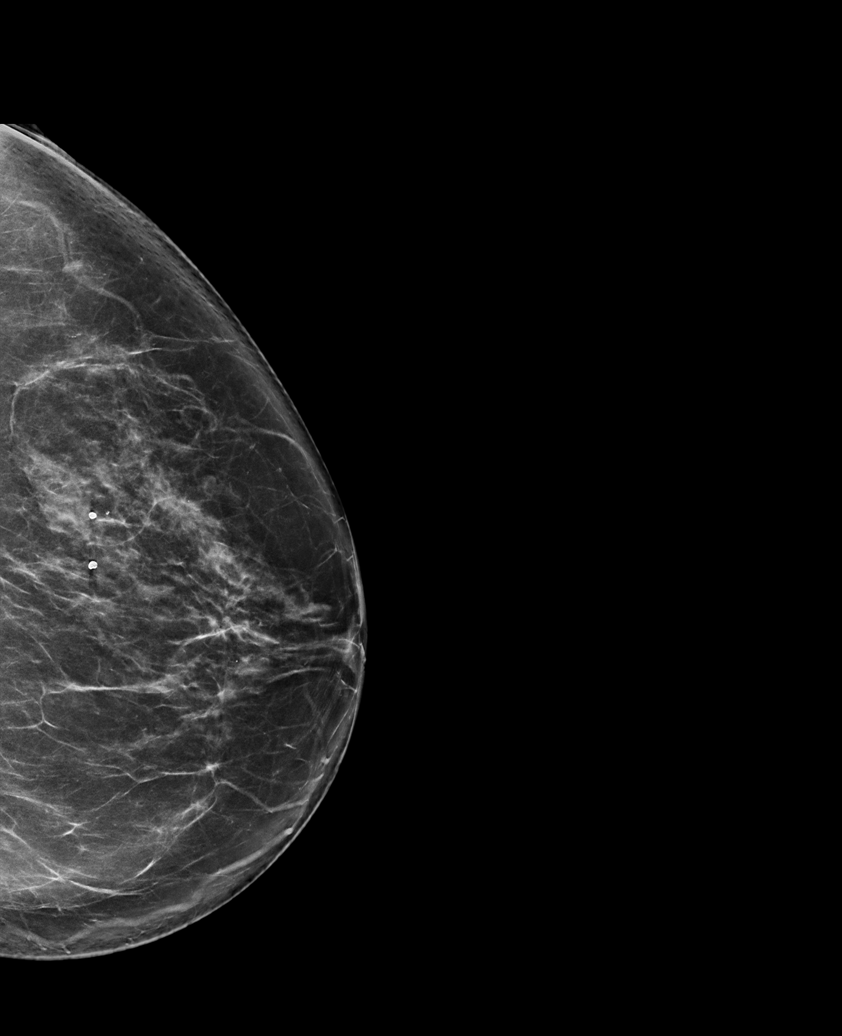

[R MLO synth-2D]
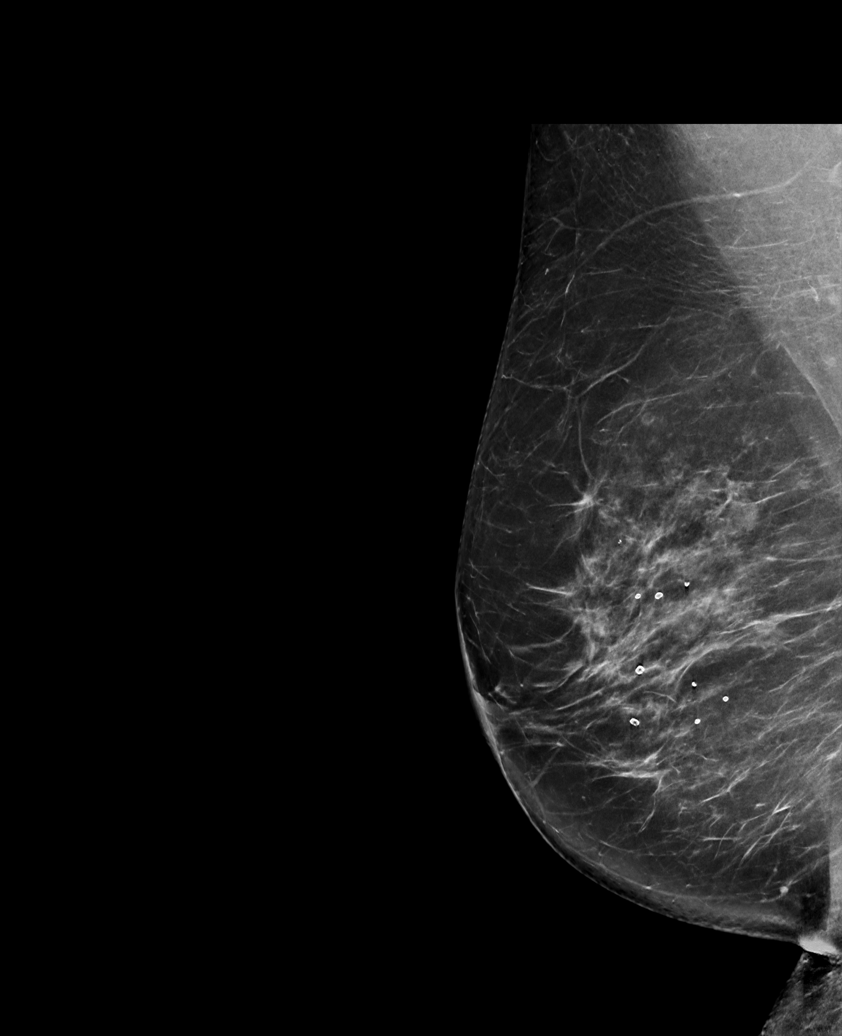

[R CC tomo · tomo slice 40/79.0]
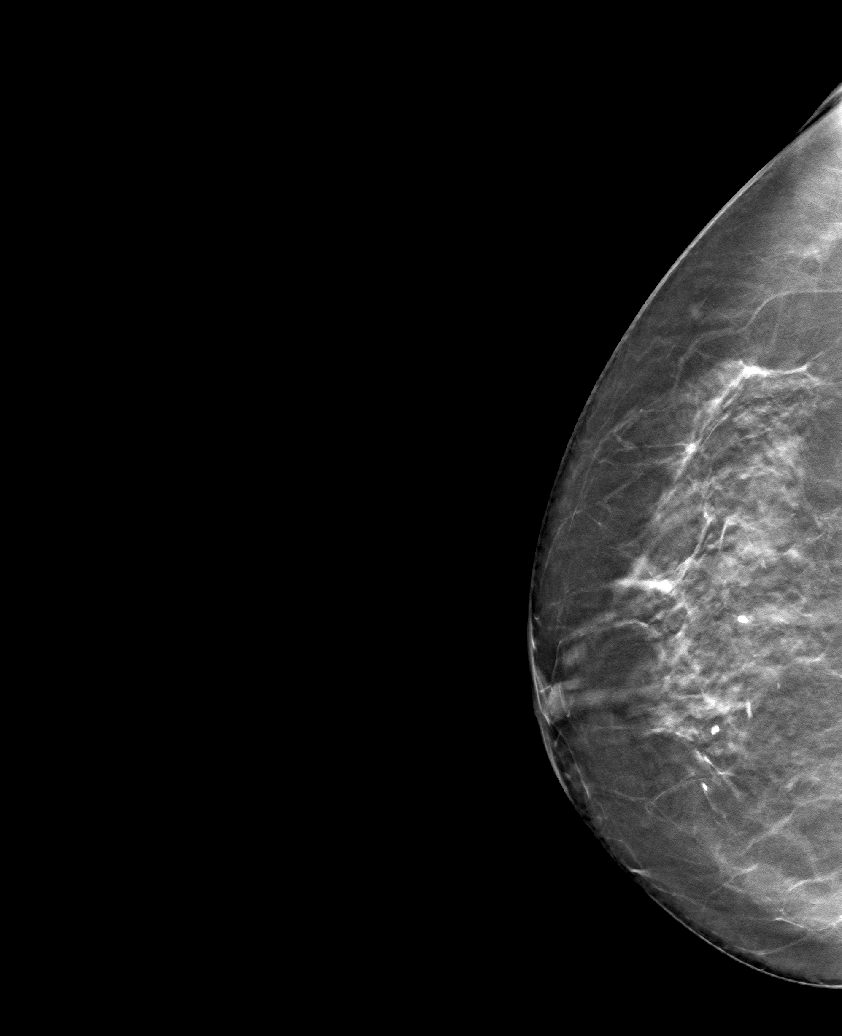

[6 of 30 positions shown; findings below may reference images not displayed]

ACR Breast Density Category c: The breast tissue is heterogeneously
dense, which may obscure small masses.
FINDINGS: There are no findings suspicious for malignancy.
IMPRESSION: No mammographic evidence of malignancy. A result letter of this
screening mammogram will be mailed directly to the patient.

RECOMMENDATION:
Screening mammogram in one year. (Code:Q3-W-BC3)

BI-RADS CATEGORY  1: Negative.

## 2021-10-19 ENCOUNTER — Encounter: Payer: Self-pay | Admitting: *Deleted

## 2021-10-19 ENCOUNTER — Ambulatory Visit: Payer: Medicare PPO | Admitting: Diagnostic Neuroimaging

## 2021-10-19 VITALS — BP 106/69 | HR 85 | Ht 66.5 in | Wt 181.1 lb

## 2021-10-19 DIAGNOSIS — G5711 Meralgia paresthetica, right lower limb: Secondary | ICD-10-CM | POA: Diagnosis not present

## 2021-10-19 NOTE — Progress Notes (Signed)
[facility]  PATIENT: Kim Yu DOB: 10-05-1955  REFERRING CLINICIAN: Traci Sermon, PA* HISTORY FROM: patient REASON FOR VISIT: new consult    HISTORICAL  CHIEF COMPLAINT:  Chief Complaint  Patient presents with   New Patient (Initial Visit)    Pt reports feeling great. No concerns. Room 6 alone    HISTORY OF PRESENT ILLNESS:   66 year old female here for evaluation of right thigh numbness.  Symptoms started 4 to 5 months ago.  She describes numbness, tingling, itching abnormal sensation in her right anterolateral thigh.  Some mild symptoms near her right lateral knee.  No back pain.  No problems with left side.  No problems with arms, face, speech, vision or swallowing.  No headaches.e symptoms fluctuate.  She is able to do activities such as walking implies exercise without difficulty.  No major change in weight in the last few years.  Denies any tightfitting clothing or belts.  REVIEW OF SYSTEMS: Full 14 system review of systems performed and negative with exception of: as per HPI.  ALLERGIES: Allergies  Allergen Reactions   Compazine [Prochlorperazine Edisylate]     Twisting muscles in face   Darvon [Propoxyphene Hcl]     dreams   Dexilant [Dexlansoprazole]     Achy joints   Pantoprazole     Achy joints    HOME MEDICATIONS: Outpatient Medications Prior to Visit  Medication Sig Dispense Refill   aspirin 81 MG tablet Take 81 mg by mouth daily.     Famotidine (PEPCID PO) Take 40 mg by mouth daily.      fexofenadine (ALLEGRA) 60 MG tablet Take 60 mg by mouth 2 (two) times daily.     FLUoxetine (PROZAC) 10 MG capsule Take 10 mg by mouth daily.     COVID-19 mRNA vaccine, Moderna, 100 MCG/0.5ML injection Inject into the muscle. 0.25 mL 0   No facility-administered medications prior to visit.    PAST MEDICAL HISTORY: Past Medical History:  Diagnosis Date   Acid reflux    Asthma    Chronic gastritis    Diverticulosis     Dyslipidemia    GERD (gastroesophageal reflux disease)    Hiatal hernia    Insomnia    Lichen sclerosus    Meralgia paresthetica of right side    Migraines    Ovarian cyst    Premature ovarian failure    SVT (supraventricular tachycardia)     PAST SURGICAL HISTORY: Past Surgical History:  Procedure Laterality Date   CARDIAC ELECTROPHYSIOLOGY STUDY AND ABLATION     OOPHORECTOMY     LSO   SHOULDER SURGERY     TEMPOROMANDIBULAR JOINT SURGERY      FAMILY HISTORY: Family History  Problem Relation Age of Onset   Hypertension Mother    Diabetes Mother    Breast cancer Mother        Age 77   Hypertension Father    Lymphoma Maternal Aunt    Ovarian cancer Maternal Aunt        Mat Great Aunt   Breast cancer Paternal Bebe Liter. GR GM-Age unknown    SOCIAL HISTORY: Social History   Socioeconomic History   Marital status: Married    Spouse name: Doren Custard   Number of children: 1   Years of education: Not on file   Highest education level: Not on file  Occupational History    Comment: teacher retired  Tobacco Use   Smoking status: Never  Smokeless tobacco: Never  Substance and Sexual Activity   Alcohol use: Yes    Alcohol/week: 0.0 standard drinks of alcohol    Comment: rare   Drug use: No   Sexual activity: Yes    Birth control/protection: Post-menopausal  Other Topics Concern   Not on file  Social History Narrative   Lives with husband   Social Determinants of Health   Financial Resource Strain: Not on file  Food Insecurity: Not on file  Transportation Needs: Not on file  Physical Activity: Not on file  Stress: Not on file  Social Connections: Not on file  Intimate Partner Violence: Not on file     PHYSICAL EXAM  GENERAL EXAM/CONSTITUTIONAL: Vitals:  Vitals:   10/19/21 1534  BP: 106/69  Pulse: 85  Weight: 181 lb 2 oz (82.2 kg)  Height: 5' 6.5" (1.689 m)   Body mass index is 28.8 kg/m. Wt Readings from Last 3 Encounters:   10/19/21 181 lb 2 oz (82.2 kg)  12/20/15 174 lb 3.2 oz (79 kg)  05/31/11 166 lb (75.3 kg)   Patient is in no distress; well developed, nourished and groomed; neck is supple  CARDIOVASCULAR: Examination of carotid arteries is normal; no carotid bruits Regular rate and rhythm, no murmurs Examination of peripheral vascular system by observation and palpation is normal  EYES: Ophthalmoscopic exam of optic discs and posterior segments is normal; no papilledema or hemorrhages No results found.  MUSCULOSKELETAL: Gait, strength, tone, movements noted in Neurologic exam below  NEUROLOGIC: MENTAL STATUS:      No data to display         awake, alert, oriented to person, place and time recent and remote memory intact normal attention and concentration language fluent, comprehension intact, naming intact fund of knowledge appropriate  CRANIAL NERVE:  2nd - no papilledema on fundoscopic exam 2nd, 3rd, 4th, 6th - pupils equal and reactive to light, visual fields full to confrontation, extraocular muscles intact, no nystagmus 5th - facial sensation symmetric 7th - facial strength symmetric 8th - hearing intact 9th - palate elevates symmetrically, uvula midline 11th - shoulder shrug symmetric 12th - tongue protrusion midline  MOTOR:  normal bulk and tone, full strength in the BUE, BLE  SENSORY:  normal and symmetric to light touch, temperature, vibration  COORDINATION:  finger-nose-finger, fine finger movements normal  REFLEXES:  deep tendon reflexes TRACE and symmetric  GAIT/STATION:  narrow based gait     DIAGNOSTIC DATA (LABS, IMAGING, TESTING) - I reviewed patient records, labs, notes, testing and imaging myself where available.  Lab Results  Component Value Date   WBC 4.4 02/18/2014   HGB 13.3 02/18/2014   HCT 39.6 02/18/2014   MCV 90.0 02/18/2014   PLT 244 02/18/2014      Component Value Date/Time   NA 139 02/18/2014 0730   K 3.9 02/18/2014 0730   CL  105 02/18/2014 0730   CO2 29 02/18/2014 0730   GLUCOSE 130 (H) 02/18/2014 0730   BUN 13 02/18/2014 0730   CREATININE 0.74 02/18/2014 0730   CALCIUM 9.6 02/18/2014 0730   GFRNONAA >90 02/18/2014 0730   GFRAA >90 02/18/2014 0730   No results found for: "CHOL", "HDL", "LDLCALC", "LDLDIRECT", "TRIG", "CHOLHDL" No results found for: "HGBA1C" No results found for: "VITAMINB12" No results found for: "TSH"     ASSESSMENT AND PLAN  66 y.o. year old female here with:   Dx:  1. Meralgia paraesthetica, right       PLAN:  RIGHT MERALGIA PARESTHETICA (  since mid 2023) - recommend conservative mgmt; continue walking, pilates exercises; try to add gentle hip mobility exercises; mild weight loss may help - may consider MRI lumbar spine, PT evaluation, nerve block, ibuprofen, tylenol, gabapentin, or topical creams in future if sxs significantly worsen  Return for return to PCP, pending if symptoms worsen or fail to improve.    Penni Bombard, MD 84/06/6597, 3:57 PM Certified in Neurology, Neurophysiology and Neuroimaging  [facility] [address], [place], [place] [address] [phone]

## 2021-10-19 NOTE — Patient Instructions (Signed)
  RIGHT MERALGIA PARESTHETICA - recommend conservative mgmt; continue walking, pilates exercises; try to add gentle hip mobility exercises

## 2021-11-12 DIAGNOSIS — S63502A Unspecified sprain of left wrist, initial encounter: Secondary | ICD-10-CM | POA: Diagnosis not present

## 2021-11-22 DIAGNOSIS — Z124 Encounter for screening for malignant neoplasm of cervix: Secondary | ICD-10-CM | POA: Diagnosis not present

## 2021-11-22 DIAGNOSIS — M25532 Pain in left wrist: Secondary | ICD-10-CM | POA: Diagnosis not present

## 2021-11-22 DIAGNOSIS — Z1151 Encounter for screening for human papillomavirus (HPV): Secondary | ICD-10-CM | POA: Diagnosis not present

## 2021-11-23 DIAGNOSIS — L900 Lichen sclerosus et atrophicus: Secondary | ICD-10-CM | POA: Diagnosis not present

## 2021-11-23 DIAGNOSIS — Z6828 Body mass index (BMI) 28.0-28.9, adult: Secondary | ICD-10-CM | POA: Diagnosis not present

## 2021-11-23 DIAGNOSIS — Z124 Encounter for screening for malignant neoplasm of cervix: Secondary | ICD-10-CM | POA: Diagnosis not present

## 2022-04-12 DIAGNOSIS — N819 Female genital prolapse, unspecified: Secondary | ICD-10-CM | POA: Diagnosis not present

## 2022-04-12 DIAGNOSIS — N760 Acute vaginitis: Secondary | ICD-10-CM | POA: Diagnosis not present

## 2022-05-24 DIAGNOSIS — N819 Female genital prolapse, unspecified: Secondary | ICD-10-CM | POA: Diagnosis not present

## 2022-05-29 DIAGNOSIS — N952 Postmenopausal atrophic vaginitis: Secondary | ICD-10-CM | POA: Diagnosis not present

## 2022-05-29 DIAGNOSIS — N819 Female genital prolapse, unspecified: Secondary | ICD-10-CM | POA: Diagnosis not present

## 2022-06-05 DIAGNOSIS — E785 Hyperlipidemia, unspecified: Secondary | ICD-10-CM | POA: Diagnosis not present

## 2022-06-05 DIAGNOSIS — M8588 Other specified disorders of bone density and structure, other site: Secondary | ICD-10-CM | POA: Diagnosis not present

## 2022-06-05 DIAGNOSIS — E559 Vitamin D deficiency, unspecified: Secondary | ICD-10-CM | POA: Diagnosis not present

## 2022-06-05 DIAGNOSIS — F419 Anxiety disorder, unspecified: Secondary | ICD-10-CM | POA: Diagnosis not present

## 2022-06-05 DIAGNOSIS — J309 Allergic rhinitis, unspecified: Secondary | ICD-10-CM | POA: Diagnosis not present

## 2022-06-05 DIAGNOSIS — Z0000 Encounter for general adult medical examination without abnormal findings: Secondary | ICD-10-CM | POA: Diagnosis not present

## 2022-06-05 DIAGNOSIS — I471 Supraventricular tachycardia, unspecified: Secondary | ICD-10-CM | POA: Diagnosis not present

## 2022-06-05 DIAGNOSIS — K219 Gastro-esophageal reflux disease without esophagitis: Secondary | ICD-10-CM | POA: Diagnosis not present

## 2022-06-05 DIAGNOSIS — L900 Lichen sclerosus et atrophicus: Secondary | ICD-10-CM | POA: Diagnosis not present

## 2022-06-05 DIAGNOSIS — J4520 Mild intermittent asthma, uncomplicated: Secondary | ICD-10-CM | POA: Diagnosis not present

## 2022-06-08 ENCOUNTER — Other Ambulatory Visit: Payer: Self-pay | Admitting: Family Medicine

## 2022-06-08 DIAGNOSIS — M8580 Other specified disorders of bone density and structure, unspecified site: Secondary | ICD-10-CM

## 2022-07-02 ENCOUNTER — Other Ambulatory Visit: Payer: Self-pay | Admitting: Family Medicine

## 2022-07-02 DIAGNOSIS — Z1231 Encounter for screening mammogram for malignant neoplasm of breast: Secondary | ICD-10-CM

## 2022-07-03 DIAGNOSIS — N8182 Incompetence or weakening of pubocervical tissue: Secondary | ICD-10-CM | POA: Diagnosis not present

## 2022-07-09 ENCOUNTER — Other Ambulatory Visit (HOSPITAL_COMMUNITY): Payer: Self-pay | Admitting: Orthopaedic Surgery

## 2022-07-09 ENCOUNTER — Ambulatory Visit (HOSPITAL_COMMUNITY)
Admission: RE | Admit: 2022-07-09 | Discharge: 2022-07-09 | Disposition: A | Discharge status: Home / Self Care | Payer: Medicare PPO | Source: Ambulatory Visit | Attending: Cardiology | Admitting: Cardiology

## 2022-07-09 DIAGNOSIS — M79661 Pain in right lower leg: Secondary | ICD-10-CM

## 2022-07-09 DIAGNOSIS — M7989 Other specified soft tissue disorders: Secondary | ICD-10-CM | POA: Diagnosis not present

## 2022-07-13 DIAGNOSIS — N819 Female genital prolapse, unspecified: Secondary | ICD-10-CM | POA: Diagnosis not present

## 2022-07-23 DIAGNOSIS — M79604 Pain in right leg: Secondary | ICD-10-CM | POA: Diagnosis not present

## 2022-08-06 DIAGNOSIS — S86811D Strain of other muscle(s) and tendon(s) at lower leg level, right leg, subsequent encounter: Secondary | ICD-10-CM | POA: Diagnosis not present

## 2022-08-08 ENCOUNTER — Ambulatory Visit: Payer: Medicare PPO

## 2022-08-09 ENCOUNTER — Ambulatory Visit
Admission: RE | Admit: 2022-08-09 | Discharge: 2022-08-09 | Disposition: A | Discharge status: Home / Self Care | Payer: Medicare PPO | Source: Ambulatory Visit | Attending: Family Medicine | Admitting: Family Medicine

## 2022-08-09 DIAGNOSIS — Z1231 Encounter for screening mammogram for malignant neoplasm of breast: Secondary | ICD-10-CM | POA: Diagnosis not present

## 2022-08-09 DIAGNOSIS — S86811D Strain of other muscle(s) and tendon(s) at lower leg level, right leg, subsequent encounter: Secondary | ICD-10-CM | POA: Diagnosis not present

## 2022-08-13 DIAGNOSIS — M79604 Pain in right leg: Secondary | ICD-10-CM | POA: Diagnosis not present

## 2022-08-16 DIAGNOSIS — S86811D Strain of other muscle(s) and tendon(s) at lower leg level, right leg, subsequent encounter: Secondary | ICD-10-CM | POA: Diagnosis not present

## 2022-08-17 DIAGNOSIS — S86811D Strain of other muscle(s) and tendon(s) at lower leg level, right leg, subsequent encounter: Secondary | ICD-10-CM | POA: Diagnosis not present

## 2022-08-17 DIAGNOSIS — M25561 Pain in right knee: Secondary | ICD-10-CM | POA: Diagnosis not present

## 2022-08-21 DIAGNOSIS — S86811D Strain of other muscle(s) and tendon(s) at lower leg level, right leg, subsequent encounter: Secondary | ICD-10-CM | POA: Diagnosis not present

## 2022-08-21 DIAGNOSIS — N819 Female genital prolapse, unspecified: Secondary | ICD-10-CM | POA: Diagnosis not present

## 2022-08-23 DIAGNOSIS — S86811D Strain of other muscle(s) and tendon(s) at lower leg level, right leg, subsequent encounter: Secondary | ICD-10-CM | POA: Diagnosis not present

## 2022-08-28 DIAGNOSIS — S86811D Strain of other muscle(s) and tendon(s) at lower leg level, right leg, subsequent encounter: Secondary | ICD-10-CM | POA: Diagnosis not present

## 2022-09-04 DIAGNOSIS — M25561 Pain in right knee: Secondary | ICD-10-CM | POA: Diagnosis not present

## 2022-09-06 DIAGNOSIS — L649 Androgenic alopecia, unspecified: Secondary | ICD-10-CM | POA: Diagnosis not present

## 2022-09-06 DIAGNOSIS — D225 Melanocytic nevi of trunk: Secondary | ICD-10-CM | POA: Diagnosis not present

## 2022-09-06 DIAGNOSIS — L814 Other melanin hyperpigmentation: Secondary | ICD-10-CM | POA: Diagnosis not present

## 2022-09-06 DIAGNOSIS — D2261 Melanocytic nevi of right upper limb, including shoulder: Secondary | ICD-10-CM | POA: Diagnosis not present

## 2022-09-06 DIAGNOSIS — L218 Other seborrheic dermatitis: Secondary | ICD-10-CM | POA: Diagnosis not present

## 2022-09-06 DIAGNOSIS — D2262 Melanocytic nevi of left upper limb, including shoulder: Secondary | ICD-10-CM | POA: Diagnosis not present

## 2022-09-27 DIAGNOSIS — G8918 Other acute postprocedural pain: Secondary | ICD-10-CM | POA: Diagnosis not present

## 2022-09-27 DIAGNOSIS — M6751 Plica syndrome, right knee: Secondary | ICD-10-CM | POA: Diagnosis not present

## 2022-09-27 DIAGNOSIS — S83231A Complex tear of medial meniscus, current injury, right knee, initial encounter: Secondary | ICD-10-CM | POA: Diagnosis not present

## 2022-09-27 DIAGNOSIS — M1711 Unilateral primary osteoarthritis, right knee: Secondary | ICD-10-CM | POA: Diagnosis not present

## 2022-09-27 DIAGNOSIS — M948X6 Other specified disorders of cartilage, lower leg: Secondary | ICD-10-CM | POA: Diagnosis not present

## 2022-10-02 DIAGNOSIS — M25661 Stiffness of right knee, not elsewhere classified: Secondary | ICD-10-CM | POA: Diagnosis not present

## 2022-10-02 DIAGNOSIS — R531 Weakness: Secondary | ICD-10-CM | POA: Diagnosis not present

## 2022-10-04 DIAGNOSIS — H5203 Hypermetropia, bilateral: Secondary | ICD-10-CM | POA: Diagnosis not present

## 2022-10-04 DIAGNOSIS — H2513 Age-related nuclear cataract, bilateral: Secondary | ICD-10-CM | POA: Diagnosis not present

## 2022-10-09 DIAGNOSIS — M25661 Stiffness of right knee, not elsewhere classified: Secondary | ICD-10-CM | POA: Diagnosis not present

## 2022-10-09 DIAGNOSIS — R531 Weakness: Secondary | ICD-10-CM | POA: Diagnosis not present

## 2022-10-11 DIAGNOSIS — M25661 Stiffness of right knee, not elsewhere classified: Secondary | ICD-10-CM | POA: Diagnosis not present

## 2022-10-11 DIAGNOSIS — R531 Weakness: Secondary | ICD-10-CM | POA: Diagnosis not present

## 2022-10-15 DIAGNOSIS — M25661 Stiffness of right knee, not elsewhere classified: Secondary | ICD-10-CM | POA: Diagnosis not present

## 2022-10-15 DIAGNOSIS — R531 Weakness: Secondary | ICD-10-CM | POA: Diagnosis not present

## 2022-10-17 DIAGNOSIS — M25661 Stiffness of right knee, not elsewhere classified: Secondary | ICD-10-CM | POA: Diagnosis not present

## 2022-10-17 DIAGNOSIS — R531 Weakness: Secondary | ICD-10-CM | POA: Diagnosis not present

## 2023-01-03 ENCOUNTER — Other Ambulatory Visit: Payer: Self-pay | Admitting: Student

## 2023-01-03 DIAGNOSIS — R1011 Right upper quadrant pain: Secondary | ICD-10-CM

## 2023-01-03 DIAGNOSIS — R142 Eructation: Secondary | ICD-10-CM | POA: Diagnosis not present

## 2023-01-03 DIAGNOSIS — K219 Gastro-esophageal reflux disease without esophagitis: Secondary | ICD-10-CM | POA: Diagnosis not present

## 2023-01-03 DIAGNOSIS — Z79899 Other long term (current) drug therapy: Secondary | ICD-10-CM | POA: Diagnosis not present

## 2023-01-10 ENCOUNTER — Ambulatory Visit
Admission: RE | Admit: 2023-01-10 | Discharge: 2023-01-10 | Disposition: A | Discharge status: Home / Self Care | Payer: Medicare PPO | Source: Ambulatory Visit | Attending: Family Medicine | Admitting: Family Medicine

## 2023-01-10 DIAGNOSIS — E2839 Other primary ovarian failure: Secondary | ICD-10-CM | POA: Diagnosis not present

## 2023-01-10 DIAGNOSIS — M8580 Other specified disorders of bone density and structure, unspecified site: Secondary | ICD-10-CM

## 2023-01-10 DIAGNOSIS — N958 Other specified menopausal and perimenopausal disorders: Secondary | ICD-10-CM | POA: Diagnosis not present

## 2023-01-10 DIAGNOSIS — M8588 Other specified disorders of bone density and structure, other site: Secondary | ICD-10-CM | POA: Diagnosis not present

## 2023-01-15 ENCOUNTER — Ambulatory Visit
Admission: RE | Admit: 2023-01-15 | Discharge: 2023-01-15 | Disposition: A | Discharge status: Home / Self Care | Payer: Medicare PPO | Source: Ambulatory Visit | Attending: Student | Admitting: Student

## 2023-01-15 DIAGNOSIS — K5730 Diverticulosis of large intestine without perforation or abscess without bleeding: Secondary | ICD-10-CM | POA: Diagnosis not present

## 2023-01-15 DIAGNOSIS — K449 Diaphragmatic hernia without obstruction or gangrene: Secondary | ICD-10-CM | POA: Diagnosis not present

## 2023-01-15 DIAGNOSIS — K760 Fatty (change of) liver, not elsewhere classified: Secondary | ICD-10-CM | POA: Diagnosis not present

## 2023-01-15 DIAGNOSIS — R1011 Right upper quadrant pain: Secondary | ICD-10-CM

## 2023-01-15 MED ORDER — IOPAMIDOL (ISOVUE-300) INJECTION 61%
100.0000 mL | Freq: Once | INTRAVENOUS | Status: AC | PRN
Start: 2023-01-15 — End: 2023-01-15
  Administered 2023-01-15: 100 mL via INTRAVENOUS

## 2023-02-12 DIAGNOSIS — N819 Female genital prolapse, unspecified: Secondary | ICD-10-CM | POA: Diagnosis not present

## 2023-03-28 DIAGNOSIS — R142 Eructation: Secondary | ICD-10-CM | POA: Diagnosis not present

## 2023-03-28 DIAGNOSIS — R140 Abdominal distension (gaseous): Secondary | ICD-10-CM | POA: Diagnosis not present

## 2023-03-28 DIAGNOSIS — R109 Unspecified abdominal pain: Secondary | ICD-10-CM | POA: Diagnosis not present

## 2023-03-28 DIAGNOSIS — K219 Gastro-esophageal reflux disease without esophagitis: Secondary | ICD-10-CM | POA: Diagnosis not present

## 2023-03-28 DIAGNOSIS — K449 Diaphragmatic hernia without obstruction or gangrene: Secondary | ICD-10-CM | POA: Diagnosis not present

## 2023-03-28 DIAGNOSIS — R12 Heartburn: Secondary | ICD-10-CM | POA: Diagnosis not present

## 2023-06-04 DIAGNOSIS — N8182 Incompetence or weakening of pubocervical tissue: Secondary | ICD-10-CM | POA: Diagnosis not present

## 2023-07-11 DIAGNOSIS — J45901 Unspecified asthma with (acute) exacerbation: Secondary | ICD-10-CM | POA: Diagnosis not present

## 2023-07-11 DIAGNOSIS — J069 Acute upper respiratory infection, unspecified: Secondary | ICD-10-CM | POA: Diagnosis not present

## 2023-07-16 DIAGNOSIS — R051 Acute cough: Secondary | ICD-10-CM | POA: Diagnosis not present

## 2023-07-16 DIAGNOSIS — Z20822 Contact with and (suspected) exposure to covid-19: Secondary | ICD-10-CM | POA: Diagnosis not present

## 2023-07-26 ENCOUNTER — Other Ambulatory Visit: Payer: Self-pay | Admitting: Family Medicine

## 2023-07-26 DIAGNOSIS — Z0000 Encounter for general adult medical examination without abnormal findings: Secondary | ICD-10-CM

## 2023-08-12 ENCOUNTER — Ambulatory Visit
Admission: RE | Admit: 2023-08-12 | Discharge: 2023-08-12 | Disposition: A | Discharge status: Home / Self Care | Source: Ambulatory Visit | Attending: Family Medicine | Admitting: Family Medicine

## 2023-08-12 DIAGNOSIS — Z0000 Encounter for general adult medical examination without abnormal findings: Secondary | ICD-10-CM

## 2023-08-12 DIAGNOSIS — Z1231 Encounter for screening mammogram for malignant neoplasm of breast: Secondary | ICD-10-CM | POA: Diagnosis not present

## 2023-08-21 ENCOUNTER — Ambulatory Visit (INDEPENDENT_AMBULATORY_CARE_PROVIDER_SITE_OTHER)

## 2023-08-21 ENCOUNTER — Ambulatory Visit: Admitting: Podiatry

## 2023-08-21 ENCOUNTER — Encounter: Payer: Self-pay | Admitting: Podiatry

## 2023-08-21 DIAGNOSIS — M7751 Other enthesopathy of right foot: Secondary | ICD-10-CM | POA: Diagnosis not present

## 2023-08-21 MED ORDER — TRIAMCINOLONE ACETONIDE 10 MG/ML IJ SUSP
10.0000 mg | Freq: Once | INTRAMUSCULAR | Status: AC
  Administered 2023-08-21: 10 mg via INTRA_ARTICULAR

## 2023-08-22 DIAGNOSIS — M8588 Other specified disorders of bone density and structure, other site: Secondary | ICD-10-CM | POA: Diagnosis not present

## 2023-08-22 DIAGNOSIS — Z0000 Encounter for general adult medical examination without abnormal findings: Secondary | ICD-10-CM | POA: Diagnosis not present

## 2023-08-22 DIAGNOSIS — I471 Supraventricular tachycardia, unspecified: Secondary | ICD-10-CM | POA: Diagnosis not present

## 2023-08-22 DIAGNOSIS — J4520 Mild intermittent asthma, uncomplicated: Secondary | ICD-10-CM | POA: Diagnosis not present

## 2023-08-22 DIAGNOSIS — F419 Anxiety disorder, unspecified: Secondary | ICD-10-CM | POA: Diagnosis not present

## 2023-08-22 DIAGNOSIS — K219 Gastro-esophageal reflux disease without esophagitis: Secondary | ICD-10-CM | POA: Diagnosis not present

## 2023-08-22 DIAGNOSIS — J309 Allergic rhinitis, unspecified: Secondary | ICD-10-CM | POA: Diagnosis not present

## 2023-08-22 DIAGNOSIS — L900 Lichen sclerosus et atrophicus: Secondary | ICD-10-CM | POA: Diagnosis not present

## 2023-08-22 DIAGNOSIS — E785 Hyperlipidemia, unspecified: Secondary | ICD-10-CM | POA: Diagnosis not present

## 2023-08-23 NOTE — Progress Notes (Signed)
 Subjective:   Patient ID: Kim Yu, female   DOB: 68 y.o.   MRN: [id number]   HPI Patient states she is developed a lot of pain in her plantar right second joint and it has been going on for around 3 months.  States her heels have been doing much better neuro   ROS      Objective:  Physical Exam  Vascular status intact with patient found to have inflammation fluid around the second MPJ right painful when pressed     Assessment:  Laboratory capsulitis second MPJ right with possibility of flexor plate involvement     Plan:  H&P reviewed went ahead today did a forefoot block of the area I aspirated the joint getting out a small amount of clear fluid injected quarter cc dexamethasone Kenalog  and advised on rigid bottom shoes.  Reappoint to reevaluate  X-rays were negative for signs of any kind of fracture of the area or arthritic condition

## 2023-09-12 ENCOUNTER — Ambulatory Visit: Admitting: Podiatry

## 2023-09-18 ENCOUNTER — Encounter: Payer: Self-pay | Admitting: Podiatry

## 2023-09-18 ENCOUNTER — Ambulatory Visit: Admitting: Podiatry

## 2023-09-18 VITALS — Ht 66.5 in | Wt 181.0 lb

## 2023-09-18 DIAGNOSIS — M7751 Other enthesopathy of right foot: Secondary | ICD-10-CM

## 2023-09-18 NOTE — Progress Notes (Signed)
 Subjective:   Patient ID: Kim Yu, female   DOB: 68 y.o.   MRN: [id number]   HPI Patient states feeling a lot better with treatment   ROS      Objective:  Physical Exam  Neurovascular status intact with inflammation of the second MPJ right that has improved but is still present with patient using padding shoe gear modifications oral anti-inflammatories     Assessment:  Improvement in capsulitis of the second MPJ right     Plan:  H&P reviewed I discussed the continuation of rigid bottom shoes metatarsal padding which were dispensed today and oral anti-inflammatories diclofenac and I want her to slowly reduce the amount that she takes.  I then discussed other treatments of orthotics if symptoms persist and will be seen back as needed

## 2023-09-20 ENCOUNTER — Encounter: Payer: Self-pay | Admitting: Podiatry

## 2023-10-16 DIAGNOSIS — D2262 Melanocytic nevi of left upper limb, including shoulder: Secondary | ICD-10-CM | POA: Diagnosis not present

## 2023-10-16 DIAGNOSIS — D2261 Melanocytic nevi of right upper limb, including shoulder: Secondary | ICD-10-CM | POA: Diagnosis not present

## 2023-10-16 DIAGNOSIS — L900 Lichen sclerosus et atrophicus: Secondary | ICD-10-CM | POA: Diagnosis not present

## 2023-10-16 DIAGNOSIS — L814 Other melanin hyperpigmentation: Secondary | ICD-10-CM | POA: Diagnosis not present

## 2023-10-16 DIAGNOSIS — D2272 Melanocytic nevi of left lower limb, including hip: Secondary | ICD-10-CM | POA: Diagnosis not present

## 2023-10-16 DIAGNOSIS — D225 Melanocytic nevi of trunk: Secondary | ICD-10-CM | POA: Diagnosis not present

## 2023-10-16 DIAGNOSIS — L821 Other seborrheic keratosis: Secondary | ICD-10-CM | POA: Diagnosis not present

## 2023-10-16 DIAGNOSIS — L218 Other seborrheic dermatitis: Secondary | ICD-10-CM | POA: Diagnosis not present

## 2023-11-17 DIAGNOSIS — R051 Acute cough: Secondary | ICD-10-CM | POA: Diagnosis not present

## 2023-11-25 DIAGNOSIS — H25013 Cortical age-related cataract, bilateral: Secondary | ICD-10-CM | POA: Diagnosis not present

## 2023-11-25 DIAGNOSIS — H2513 Age-related nuclear cataract, bilateral: Secondary | ICD-10-CM | POA: Diagnosis not present

## 2023-11-25 DIAGNOSIS — H5203 Hypermetropia, bilateral: Secondary | ICD-10-CM | POA: Diagnosis not present

## 2023-12-03 DIAGNOSIS — Z01419 Encounter for gynecological examination (general) (routine) without abnormal findings: Secondary | ICD-10-CM | POA: Diagnosis not present

## 2023-12-03 DIAGNOSIS — Z6830 Body mass index (BMI) 30.0-30.9, adult: Secondary | ICD-10-CM | POA: Diagnosis not present

## 2023-12-03 DIAGNOSIS — N952 Postmenopausal atrophic vaginitis: Secondary | ICD-10-CM | POA: Diagnosis not present

## 2023-12-03 DIAGNOSIS — L900 Lichen sclerosus et atrophicus: Secondary | ICD-10-CM | POA: Diagnosis not present

## 2023-12-09 DIAGNOSIS — M8588 Other specified disorders of bone density and structure, other site: Secondary | ICD-10-CM | POA: Diagnosis not present

## 2023-12-09 DIAGNOSIS — E663 Overweight: Secondary | ICD-10-CM | POA: Diagnosis not present

## 2023-12-09 DIAGNOSIS — E785 Hyperlipidemia, unspecified: Secondary | ICD-10-CM | POA: Diagnosis not present

## 2023-12-09 DIAGNOSIS — K219 Gastro-esophageal reflux disease without esophagitis: Secondary | ICD-10-CM | POA: Diagnosis not present

## 2023-12-09 DIAGNOSIS — Z833 Family history of diabetes mellitus: Secondary | ICD-10-CM | POA: Diagnosis not present

## 2023-12-09 DIAGNOSIS — H2513 Age-related nuclear cataract, bilateral: Secondary | ICD-10-CM | POA: Diagnosis not present

## 2023-12-09 DIAGNOSIS — K760 Fatty (change of) liver, not elsewhere classified: Secondary | ICD-10-CM | POA: Diagnosis not present

## 2023-12-09 DIAGNOSIS — R635 Abnormal weight gain: Secondary | ICD-10-CM | POA: Diagnosis not present

## 2023-12-09 DIAGNOSIS — J4520 Mild intermittent asthma, uncomplicated: Secondary | ICD-10-CM | POA: Diagnosis not present

## 2023-12-09 DIAGNOSIS — E559 Vitamin D deficiency, unspecified: Secondary | ICD-10-CM | POA: Diagnosis not present

## 2023-12-09 DIAGNOSIS — Z6829 Body mass index (BMI) 29.0-29.9, adult: Secondary | ICD-10-CM | POA: Diagnosis not present

## 2023-12-09 DIAGNOSIS — H25011 Cortical age-related cataract, right eye: Secondary | ICD-10-CM | POA: Diagnosis not present

## 2023-12-09 DIAGNOSIS — Z131 Encounter for screening for diabetes mellitus: Secondary | ICD-10-CM | POA: Diagnosis not present

## 2023-12-24 DIAGNOSIS — E663 Overweight: Secondary | ICD-10-CM | POA: Diagnosis not present

## 2023-12-24 DIAGNOSIS — Z6829 Body mass index (BMI) 29.0-29.9, adult: Secondary | ICD-10-CM | POA: Diagnosis not present

## 2023-12-24 DIAGNOSIS — R7303 Prediabetes: Secondary | ICD-10-CM | POA: Diagnosis not present

## 2023-12-24 DIAGNOSIS — R635 Abnormal weight gain: Secondary | ICD-10-CM | POA: Diagnosis not present

## 2023-12-24 DIAGNOSIS — M8588 Other specified disorders of bone density and structure, other site: Secondary | ICD-10-CM | POA: Diagnosis not present

## 2023-12-24 DIAGNOSIS — K219 Gastro-esophageal reflux disease without esophagitis: Secondary | ICD-10-CM | POA: Diagnosis not present

## 2023-12-24 DIAGNOSIS — E559 Vitamin D deficiency, unspecified: Secondary | ICD-10-CM | POA: Diagnosis not present

## 2023-12-24 DIAGNOSIS — E785 Hyperlipidemia, unspecified: Secondary | ICD-10-CM | POA: Diagnosis not present

## 2023-12-24 DIAGNOSIS — K760 Fatty (change of) liver, not elsewhere classified: Secondary | ICD-10-CM | POA: Diagnosis not present
